# Patient Record
Sex: Male | Born: 1945 | Race: White | Hispanic: No | Marital: Married | Smoking: Never smoker
Health system: Southern US, Community
[De-identification: ages and names within clinical notes are randomized; demographics above are authoritative.]

## PROBLEM LIST (undated history)

## (undated) DIAGNOSIS — I10 Essential (primary) hypertension: Secondary | ICD-10-CM

## (undated) DIAGNOSIS — Z860100 Personal history of colon polyps, unspecified: Secondary | ICD-10-CM

## (undated) DIAGNOSIS — E119 Type 2 diabetes mellitus without complications: Secondary | ICD-10-CM

## (undated) DIAGNOSIS — G4730 Sleep apnea, unspecified: Secondary | ICD-10-CM

## (undated) DIAGNOSIS — K219 Gastro-esophageal reflux disease without esophagitis: Secondary | ICD-10-CM

## (undated) DIAGNOSIS — E785 Hyperlipidemia, unspecified: Secondary | ICD-10-CM

## (undated) HISTORY — DX: Gastro-esophageal reflux disease without esophagitis: K21.9

## (undated) HISTORY — DX: Hyperlipidemia, unspecified: E78.5

## (undated) HISTORY — DX: Personal history of colonic polyps: Z86.010

## (undated) HISTORY — PX: CATARACT EXTRACTION, BILATERAL: SHX1313

## (undated) HISTORY — DX: Type 2 diabetes mellitus without complications: E11.9

## (undated) HISTORY — DX: Personal history of colon polyps, unspecified: Z86.0100

---

## 2006-04-30 HISTORY — PX: COLONOSCOPY: SHX174

## 2008-06-15 ENCOUNTER — Ambulatory Visit: Payer: Self-pay | Admitting: Cardiology

## 2010-06-18 ENCOUNTER — Ambulatory Visit: Payer: Self-pay | Admitting: Internal Medicine

## 2010-06-18 DIAGNOSIS — K219 Gastro-esophageal reflux disease without esophagitis: Secondary | ICD-10-CM | POA: Insufficient documentation

## 2010-06-18 DIAGNOSIS — R1310 Dysphagia, unspecified: Secondary | ICD-10-CM | POA: Insufficient documentation

## 2010-06-19 ENCOUNTER — Encounter: Payer: Self-pay | Admitting: Internal Medicine

## 2010-07-06 ENCOUNTER — Ambulatory Visit (HOSPITAL_COMMUNITY): Admission: RE | Admit: 2010-07-06 | Discharge: 2010-07-06 | Payer: Self-pay | Admitting: Internal Medicine

## 2010-07-06 ENCOUNTER — Ambulatory Visit: Payer: Self-pay | Admitting: Internal Medicine

## 2010-07-06 HISTORY — PX: ESOPHAGOGASTRODUODENOSCOPY: SHX1529

## 2010-10-27 NOTE — Assessment & Plan Note (Signed)
Summary: DYSPHAGIA/SS   Visit Type:  Initial Consult Referring Edwin Burgess:  Edwin Burgess Primary Care Edwin Burgess:  Edwin Burgess  Chief Complaint:  dysphagia.  History of Present Illness: Edwin Burgess is a pleasant 65 y/o obese WM, who presents at request of Dr. Sherryll Burgess for further evaluation of dysphagia.  Chronic dysphagia since 1990s. EGD by Dr. Cleotis Nipper in 90s reported showed hiatal hernia. Has trouble swallowing pills, stick in throat/upper esophagus. Cuts his pills up or takes liquid form. Cuts food up finely, chews thoroughly. Sometimes food gets lodged in esophagus and it comes back up. No odynophagia. No heartburn since on ranitidine. Started taking ranitidine in the 1990s.  Before had nocturnal symptoms. No BM issues. Denies constipation, diarrhea, melena, brbpr. Last TCS 2007 by Dr. Cleotis Nipper, ?polyps.    Current Medications (verified): 1)  Glucophage 500 Mg Tabs (Metformin Hcl) .... One Tablet Twice Daily 2)  Fish Oil Liquid .... Take 1000 Mg Daily 3)  Singulair 10 Mg Tabs (Montelukast Sodium) .... Take 1 Tablet By Mouth Once A Day 4)  Advair Diskus 250-50 Mcg/dose Aepb (Fluticasone-Salmeterol) .... Take 1 Tablet By Mouth Two Times A Day 5)  Ranitidine Hcl 150 Mg Tabs (Ranitidine Hcl) .... Take 1 Tablet By Mouth Once A Day At Bedtime 6)  Claritin 10 Mg Tabs (Loratadine) .... Take 1 Tablet By Mouth Once A Day 7)  Centrum Multi-Vitamin Chewables .... Once Daily  Allergies (verified): 1)  ! Tetracycline  Past History:  Past Medical History: Diabetes EGD, Dr. Cleotis Nipper, 1999, hiatal hernia per patient Asthma Hyperlipidemia GERD TCS, 2007, Dr. Cleotis Nipper, ?polyps  Past Surgical History: right eye cataract left eye cataract  Family History: Maternal GF, colon cancer, advanced age, deceased age 2.  Social History: Married. Two children. Retired Runner, broadcasting/film/video. Still substitutes.  [employer]. Never smoked. No alcohol. No drugs.   Review of Systems General:  Denies fever, chills,  sweats, anorexia, fatigue, weakness, and weight loss. Eyes:  Denies vision loss. ENT:  Complains of difficulty swallowing; denies sore throat and hoarseness. CV:  Denies chest pains, angina, palpitations, dyspnea on exertion, and peripheral edema. Resp:  Denies dyspnea at rest, dyspnea with exercise, cough, sputum, and wheezing. GI:  See HPI. GU:  Denies urinary burning and blood in urine. MS:  Denies joint pain / LOM. Derm:  Denies rash and itching. Neuro:  Denies weakness, frequent headaches, memory loss, and confusion. Psych:  Denies depression and anxiety. Endo:  Denies unusual weight change. Heme:  Denies bruising and bleeding. Allergy:  Denies hives and rash.  Vital Signs:  Patient profile:   65 year old male Height:      66 inches Weight:      208 pounds BMI:     33.69 Temp:     98.0 degrees F oral Pulse rate:   88 / minute BP sitting:   130 / 74  (left arm) Cuff size:   regular  Vitals Entered By: Cloria Spring LPN (June 18, 2010 2:39 PM)  Physical Exam  General:  Well developed, well nourished, no acute distress.obese.   Head:  Normocephalic and atraumatic. Eyes:  sclera nonicteric Mouth:  Oropharyngeal mucosa moist, pink.  No lesions, erythema or exudate.    Neck:  Supple; no masses or thyromegaly. Lungs:  Clear throughout to auscultation. Heart:  Regular rate and rhythm; no murmurs, rubs,  or bruits. Abdomen:  obese. Bowel sounds normal.  Abdomen is soft, nontender, nondistended.  No rebound or guarding.  No hepatosplenomegaly, masses or hernias.  No abdominal bruits.  Extremities:  No clubbing, cyanosis, edema or deformities noted. Neurologic:  Alert and  oriented x4;  grossly normal neurologically. Skin:  Intact without significant lesions or rashes. Cervical Nodes:  No significant cervical adenopathy. Psych:  Alert and cooperative. Normal mood and affect.  Impression & Recommendations:  Problem # 1:  DYSPHAGIA UNSPECIFIED (ICD-787.20)  Dysphagia to  solids and pills, chronic. Chronic GERD, symptoms well-controlled on H2 blocker. DDx includes esophageal web, ring, stricture. Less likely malignancy. EGD/ED to be performed in near future.  Risks, alternatives, benefits including but not limited to risk of reaction to medications, bleeding, infection, and perforation addressed.  Patient voiced understanding and verbal consent obtained.   Orders: Consultation Level III (16109)  Problem # 2:  SCREENING COLORECTAL-CANCER (ICD-V76.51) Patient had last TCS 2007. ?h/o colonic polyps. Advised he follow-up with Dr. Sherryll Burgess to determine when next TCS is due based on previous findings. May be due next year if h/o adenomatous polyps. I would like to thank Dr. Sherryll Burgess for allowing [place] to take part in the care of this nice patient.

## 2010-10-27 NOTE — Letter (Signed)
Summary: REFERRAL FROM DR [facility]  REFERRAL FROM DR [facility]   Imported By: Rexene Alberts 06/19/2010 12:20:50  _____________________________________________________________________  External Attachment:    Type:   Image     Comment:   External Document

## 2010-10-27 NOTE — Letter (Signed)
Summary: EGD/ED ORDER  EGD/ED ORDER   Imported By: Ave Filter 06/18/2010 15:22:11  _____________________________________________________________________  External Attachment:    Type:   Image     Comment:   External Document

## 2010-12-10 LAB — GLUCOSE, CAPILLARY

## 2010-12-14 ENCOUNTER — Encounter: Payer: Self-pay | Admitting: Gastroenterology

## 2010-12-24 NOTE — Medication Information (Signed)
Summary: PA for pantoprazole  PA for pantoprazole   Imported By: Hendricks Limes LPN 03/47/4259 56:38:75  _____________________________________________________________________  External Attachment:    Type:   Image     Comment:   External Document

## 2011-11-03 ENCOUNTER — Encounter: Payer: Self-pay | Admitting: Internal Medicine

## 2011-11-11 ENCOUNTER — Encounter: Payer: Self-pay | Admitting: Internal Medicine

## 2011-11-12 ENCOUNTER — Encounter: Payer: Self-pay | Admitting: Gastroenterology

## 2011-11-12 ENCOUNTER — Ambulatory Visit (INDEPENDENT_AMBULATORY_CARE_PROVIDER_SITE_OTHER): Payer: Medicare Other | Admitting: Gastroenterology

## 2011-11-12 VITALS — BP 140/76 | HR 93 | Temp 97.6°F | Ht 66.0 in | Wt 194.4 lb

## 2011-11-12 DIAGNOSIS — K219 Gastro-esophageal reflux disease without esophagitis: Secondary | ICD-10-CM

## 2011-11-12 MED ORDER — PANTOPRAZOLE SODIUM 40 MG PO TBEC: 40.0000 mg | DELAYED_RELEASE_TABLET | Freq: Every day | ORAL | Status: DC

## 2011-11-12 NOTE — Patient Instructions (Signed)
Please discuss with Dr. Sherryll Burger regarding when your next colonoscopy is due.  Office visit here in one year.

## 2011-11-12 NOTE — Progress Notes (Signed)
Primary Care Physician: Kirstie Peri, MD, MD  Primary Gastroenterologist:  Roetta Sessions, MD   Chief Complaint  Patient presents with  . Medication Refill    HPI: Edwin Burgess is a 66 y.o. male here for follow-up GERD/dysphagia. Doing well since EGD 2011. Dysphagia resolved. No heartburn. Takes protonix daily. No abdominal pain, constipation, diarrhea, melena, brbpr. ?h/o colon polyps in 2007 on TCS by Dr. Marcell Anger.   Current Outpatient Prescriptions  Medication Sig Dispense Refill  . albuterol (PROVENTIL HFA;VENTOLIN HFA) 108 (90 BASE) MCG/ACT inhaler Inhale 2 puffs into the lungs every 6 (six) hours as needed.      Marland Kitchen aspirin 81 MG tablet Take 81 mg by mouth daily.      . Coenzyme Q10 (CO Q 10 PO) Take 200 mg by mouth daily.      . fish oil-omega-3 fatty acids 1000 MG capsule Take 3 g by mouth daily.      . Fluticasone-Salmeterol (ADVAIR) 250-50 MCG/DOSE AEPB Inhale 1 puff into the lungs every 12 (twelve) hours.      Marland Kitchen loratadine (CLARITIN) 10 MG tablet Take 10 mg by mouth daily.      . metFORMIN (GLUCOPHAGE) 500 MG tablet Take 500 mg by mouth 2 (two) times daily with a meal.       . montelukast (SINGULAIR) 10 MG tablet Take 10 mg by mouth at bedtime.      . Multiple Vitamin (MULTIVITAMIN) capsule Take 1 capsule by mouth daily.      . pantoprazole (PROTONIX) 40 MG tablet Take 40 mg by mouth daily.       . theophylline (THEODUR) 300 MG 12 hr tablet Take 300 mg by mouth 2 (two) times daily.         Allergies as of 11/12/2011 - Review Complete 11/12/2011  Allergen Reaction Noted  . Tetracycline      ROS:  General: Negative for anorexia, weight loss, fever, chills, fatigue, weakness. ENT: Negative for hoarseness, difficulty swallowing , nasal congestion. CV: Negative for chest pain, angina, palpitations, dyspnea on exertion, peripheral edema.  Respiratory: Negative for dyspnea at rest, dyspnea on exertion, cough, sputum, wheezing.  GI: See history of present illness. GU:   Negative for dysuria, hematuria, urinary incontinence, urinary frequency, nocturnal urination.  Endo: Negative for unusual weight change.    Physical Examination:   BP 140/76  Pulse 93  Temp(Src) 97.6 F (36.4 C) (Temporal)  Ht 5\' 6"  (1.676 m)  Wt 194 lb 6.4 oz (88.179 kg)  BMI 31.38 kg/m2  General: Well-nourished, well-developed in no acute distress.  Eyes: No icterus. Mouth: Oropharyngeal mucosa moist and pink , no lesions erythema or exudate. Lungs: Clear to auscultation bilaterally.  Heart: Regular rate and rhythm, no murmurs rubs or gallops.  Abdomen: Bowel sounds are normal, nontender, nondistended, no hepatosplenomegaly or masses, no abdominal bruits or hernia , no rebound or guarding.   Extremities: No lower extremity edema. No clubbing or deformities. Neuro: Alert and oriented x 4   Skin: Warm and dry, no jaundice.   Psych: Alert and cooperative, normal mood and affect.

## 2011-11-12 NOTE — Assessment & Plan Note (Signed)
Doing well. Continue anti-reflux measures. Continue pantoprazole 40mg  daily. New RX for one year provided. OV one year.  Advised to discuss with Dr. Sherryll Burger next month at his PE regarding his next colonoscopy. We do not have his previous reports but if he had adenomatous polyps on prior colonoscopy, would advise for f/u TCS 5 years after last one. He agrees to discuss with Dr. Sherryll Burger and call if has any questions.

## 2011-11-15 NOTE — Progress Notes (Signed)
Faxed to PCP

## 2011-11-23 ENCOUNTER — Ambulatory Visit: Payer: Self-pay | Admitting: Internal Medicine

## 2011-12-01 NOTE — Progress Notes (Signed)
REVIEWED.  

## 2012-05-15 ENCOUNTER — Ambulatory Visit (INDEPENDENT_AMBULATORY_CARE_PROVIDER_SITE_OTHER): Payer: Medicare Other | Admitting: Gastroenterology

## 2012-05-15 ENCOUNTER — Other Ambulatory Visit: Payer: Self-pay | Admitting: Gastroenterology

## 2012-05-15 ENCOUNTER — Encounter: Payer: Self-pay | Admitting: Gastroenterology

## 2012-05-15 VITALS — BP 142/81 | HR 88 | Temp 98.2°F | Ht 66.0 in | Wt 193.4 lb

## 2012-05-15 DIAGNOSIS — K219 Gastro-esophageal reflux disease without esophagitis: Secondary | ICD-10-CM

## 2012-05-15 DIAGNOSIS — Z860100 Personal history of colon polyps, unspecified: Secondary | ICD-10-CM

## 2012-05-15 DIAGNOSIS — Z860101 Personal history of adenomatous and serrated colon polyps: Secondary | ICD-10-CM | POA: Insufficient documentation

## 2012-05-15 DIAGNOSIS — Z800 Family history of malignant neoplasm of digestive organs: Secondary | ICD-10-CM

## 2012-05-15 MED ORDER — PEG 3350-KCL-NA BICARB-NACL 420 G PO SOLR: 4000.0000 L | ORAL | Status: AC

## 2012-05-15 NOTE — Assessment & Plan Note (Signed)
Doing well on pantoprazole.  

## 2012-05-15 NOTE — Patient Instructions (Addendum)
We have scheduled you for a colonoscopy with Dr. Darrick Penna. Please see separate instructions.  Day of bowel prep you will take 1/2 dose of metformin (250mg  twice with meal).

## 2012-05-15 NOTE — Progress Notes (Signed)
Primary Care Physician:  Kirstie Peri, MD  Primary Gastroenterologist:  Jonette Eva, MD   Chief Complaint  Patient presents with  . Colonoscopy    HPI:  Edwin Burgess is a 66 y.o. male here for consideration of colonoscopy. He has h/o colon polyps in the past. At time of colonoscopy with Dr. Cleotis Nipper in 2007, mild diverticulosis. He was last seen here in 10/2011 for f/u GERD/dysphaiga.  He is doing well. Protonix controls his heartburn. No dysphagia to solid foods or liquids. Has little trouble with pills but thinks it is psychological, has chronically had issues. No n/v, abd pain, constipation, melena, brbpr, unintentional weight loss. He has intermittent diarrhea but not bad. States Dr. Sherryll Burger says it is time for colonoscopy based on previous records.    Current Outpatient Prescriptions  Medication Sig Dispense Refill  . albuterol (PROVENTIL HFA;VENTOLIN HFA) 108 (90 BASE) MCG/ACT inhaler Inhale 2 puffs into the lungs every 6 (six) hours as needed.      Marland Kitchen aspirin 81 MG tablet Take 81 mg by mouth daily.      . Fluticasone-Salmeterol (ADVAIR) 250-50 MCG/DOSE AEPB Inhale 1 puff into the lungs every 12 (twelve) hours.      Marland Kitchen loratadine (CLARITIN) 10 MG tablet Take 10 mg by mouth daily.      . metFORMIN (GLUCOPHAGE) 500 MG tablet Take 500 mg by mouth 2 (two) times daily with a meal.       . montelukast (SINGULAIR) 10 MG tablet Take 10 mg by mouth at bedtime.      . Multiple Vitamin (MULTIVITAMIN) capsule Take 1 capsule by mouth daily.      . pantoprazole (PROTONIX) 40 MG tablet Take 1 tablet (40 mg total) by mouth daily.  30 tablet  11  . theophylline (THEODUR) 300 MG 12 hr tablet Take 300 mg by mouth 2 (two) times daily.         Allergies as of 05/15/2012 - Review Complete 05/15/2012  Allergen Reaction Noted  . Tetracycline      Past Medical History  Diagnosis Date  . GERD (gastroesophageal reflux disease)   . Asthma   . DM (diabetes mellitus)   . Hyperlipidemia   . Personal  history of colonic polyps     Past Surgical History  Procedure Date  . Esophagogastroduodenoscopy 07/06/10    tight schatzki ring s/p dilation/small hiatal hernia  . Cataract extraction, bilateral   . Colonoscopy Aug 12/2005    dr.fleishman/mild diverticulosis. recommended to have next TCS 2012.    Family History  Problem Relation Age of Onset  . Colon cancer Maternal Grandfather 91  . Breast cancer Maternal Aunt     History   Social History  . Marital Status: Married    Spouse Name: N/A    Number of Children: 2  . Years of Education: N/A   Occupational History  . [employer]   . retired Engineer, site    Social History Main Topics  . Smoking status: Never Smoker   . Smokeless tobacco: Not on file  . Alcohol Use: No  . Drug Use: No  . Sexually Active: Not on file   Other Topics Concern  . Not on file   Social History Narrative  . No narrative on file      ROS:  General: Negative for anorexia, weight loss, fever, chills, fatigue, weakness. Eyes: Negative for vision changes.  ENT: Negative for hoarseness, difficulty swallowing , nasal congestion. CV: Negative for chest pain, angina, palpitations, dyspnea on  exertion, peripheral edema.  Respiratory: Negative for dyspnea at rest, dyspnea on exertion, cough, sputum, wheezing.  GI: See history of present illness. GU:  Negative for dysuria, hematuria, urinary incontinence, urinary frequency, nocturnal urination.  MS: Negative for joint pain, low back pain.  Derm: Negative for rash or itching.  Neuro: Negative for weakness, abnormal sensation, seizure, frequent headaches, memory loss, confusion.  Psych: Negative for anxiety, depression, suicidal ideation, hallucinations.  Endo: Negative for unusual weight change.  Heme: Negative for bruising or bleeding. Allergy: Negative for rash or hives.    Physical Examination:  BP 142/81  Pulse 88  Temp 98.2 F (36.8 C) (Temporal)  Ht 5\' 6"  (1.676 m)  Wt 193  lb 6.4 oz (87.726 kg)  BMI 31.22 kg/m2   General: Well-nourished, well-developed in no acute distress.  Head: Normocephalic, atraumatic.   Eyes: Conjunctiva pink, no icterus. Mouth: Oropharyngeal mucosa moist and pink , no lesions erythema or exudate. Neck: Supple without thyromegaly, masses, or lymphadenopathy.  Lungs: Clear to auscultation bilaterally.  Heart: Regular rate and rhythm, no murmurs rubs or gallops.  Abdomen: Bowel sounds are normal, nontender, nondistended, no hepatosplenomegaly or masses, no abdominal bruits or    hernia , no rebound or guarding.   Rectal: defer Extremities: No lower extremity edema. No clubbing or deformities.  Neuro: Alert and oriented x 4 , grossly normal neurologically.  Skin: Warm and dry, no rash or jaundice.   Psych: Alert and cooperative, normal mood and affect.

## 2012-05-15 NOTE — Progress Notes (Signed)
Faxed to PCP

## 2012-05-15 NOTE — Assessment & Plan Note (Signed)
Patient reports h/o colon polyps on prior colonoscopy. His last TCS in 2007. He has family hx of breast and colon cancer in second degree relatives. Offered colonoscopy at this time.  I have discussed the risks, alternatives, benefits with regards to but not limited to the risk of reaction to medication, bleeding, infection, perforation and the patient is agreeable to proceed. Written consent to be obtained.  Day of prep: 1/2 dose metformin.

## 2012-05-31 ENCOUNTER — Encounter (HOSPITAL_COMMUNITY): Payer: Self-pay | Admitting: Pharmacy Technician

## 2012-06-15 ENCOUNTER — Encounter (HOSPITAL_COMMUNITY): Payer: Self-pay | Admitting: *Deleted

## 2012-06-15 ENCOUNTER — Ambulatory Visit (HOSPITAL_COMMUNITY)
Admission: RE | Admit: 2012-06-15 | Discharge: 2012-06-15 | Disposition: A | Discharge status: Home / Self Care | Payer: Medicare Other | Source: Ambulatory Visit | Attending: Gastroenterology | Admitting: Gastroenterology

## 2012-06-15 ENCOUNTER — Encounter (HOSPITAL_COMMUNITY)
Admission: RE | Disposition: A | Discharge status: Home / Self Care | Payer: Self-pay | Source: Ambulatory Visit | Attending: Gastroenterology

## 2012-06-15 DIAGNOSIS — K648 Other hemorrhoids: Secondary | ICD-10-CM

## 2012-06-15 DIAGNOSIS — K5730 Diverticulosis of large intestine without perforation or abscess without bleeding: Secondary | ICD-10-CM | POA: Insufficient documentation

## 2012-06-15 DIAGNOSIS — Z1211 Encounter for screening for malignant neoplasm of colon: Secondary | ICD-10-CM

## 2012-06-15 DIAGNOSIS — E119 Type 2 diabetes mellitus without complications: Secondary | ICD-10-CM | POA: Insufficient documentation

## 2012-06-15 DIAGNOSIS — Z860100 Personal history of colon polyps, unspecified: Secondary | ICD-10-CM | POA: Insufficient documentation

## 2012-06-15 DIAGNOSIS — Z800 Family history of malignant neoplasm of digestive organs: Secondary | ICD-10-CM

## 2012-06-15 DIAGNOSIS — D126 Benign neoplasm of colon, unspecified: Secondary | ICD-10-CM | POA: Insufficient documentation

## 2012-06-15 DIAGNOSIS — E785 Hyperlipidemia, unspecified: Secondary | ICD-10-CM | POA: Insufficient documentation

## 2012-06-15 HISTORY — PX: COLONOSCOPY: SHX5424

## 2012-06-15 LAB — GLUCOSE, CAPILLARY: Glucose-Capillary: 142 mg/dL — ABNORMAL HIGH (ref 70–99)

## 2012-06-15 SURGERY — COLONOSCOPY
Anesthesia: Moderate Sedation

## 2012-06-15 MED ORDER — MEPERIDINE HCL 100 MG/ML IJ SOLN
INTRAMUSCULAR | Status: AC
  Filled 2012-06-15: qty 2

## 2012-06-15 MED ORDER — MIDAZOLAM HCL 5 MG/5ML IJ SOLN
INTRAMUSCULAR | Status: AC
  Filled 2012-06-15: qty 10

## 2012-06-15 MED ORDER — SODIUM CHLORIDE 0.45 % IV SOLN
INTRAVENOUS | Status: DC
  Administered 2012-06-15: 12:00:00 via INTRAVENOUS

## 2012-06-15 MED ORDER — STERILE WATER FOR IRRIGATION IR SOLN
Status: DC | PRN
  Administered 2012-06-15: 12:00:00

## 2012-06-15 MED ORDER — MEPERIDINE HCL 100 MG/ML IJ SOLN
INTRAMUSCULAR | Status: DC | PRN
  Administered 2012-06-15 (×2): 25 mg via INTRAVENOUS

## 2012-06-15 MED ORDER — MIDAZOLAM HCL 5 MG/5ML IJ SOLN
INTRAMUSCULAR | Status: DC | PRN
  Administered 2012-06-15 (×2): 2 mg via INTRAVENOUS
  Administered 2012-06-15: 1 mg via INTRAVENOUS

## 2012-06-15 NOTE — H&P (Signed)
  Primary Care Physician:  Kirstie Peri, MD Primary Gastroenterologist:  Dr. Darrick Penna  Pre-Procedure History & Physical: HPI:  Edwin Burgess is a 66 y.o. male here for  PERSONAL HISTORY OF POLYPS.   Past Medical History  Diagnosis Date  . GERD (gastroesophageal reflux disease)   . Asthma   . DM (diabetes mellitus)   . Hyperlipidemia   . Personal history of colonic polyps     Past Surgical History  Procedure Date  . Esophagogastroduodenoscopy 07/06/10    tight schatzki ring s/p dilation/small hiatal hernia  . Cataract extraction, bilateral   . Colonoscopy Aug 12/2005    dr.fleishman/mild diverticulosis. recommended to have next TCS 2012.    Prior to Admission medications   Medication Sig Start Date End Date Taking? Authorizing Provider  albuterol (PROVENTIL HFA;VENTOLIN HFA) 108 (90 BASE) MCG/ACT inhaler Inhale 2 puffs into the lungs every 6 (six) hours as needed. Asthma   Yes Historical Provider, MD  aspirin 81 MG tablet Take 81 mg by mouth daily.   Yes Historical Provider, MD  Fluticasone-Salmeterol (ADVAIR) 250-50 MCG/DOSE AEPB Inhale 1 puff into the lungs every 12 (twelve) hours.   Yes Historical Provider, MD  loratadine (CLARITIN) 10 MG tablet Take 10 mg by mouth daily.   Yes Historical Provider, MD  metFORMIN (GLUCOPHAGE) 500 MG tablet Take 1,000 mg by mouth at bedtime.  10/13/11  Yes Historical Provider, MD  Multiple Vitamin (MULTIVITAMIN) capsule Take 1 capsule by mouth daily.   Yes Historical Provider, MD  theophylline (THEODUR) 300 MG 12 hr tablet Take 300 mg by mouth 2 (two) times daily.  10/19/11  Yes Historical Provider, MD  montelukast (SINGULAIR) 10 MG tablet Take 10 mg by mouth at bedtime.    Historical Provider, MD  pantoprazole (PROTONIX) 40 MG tablet Take 40 mg by mouth at bedtime. 11/12/11   Tiffany Kocher, PA    Allergies as of 05/15/2012 - Review Complete 05/15/2012  Allergen Reaction Noted  . Tetracycline      Family History  Problem Relation Age of Onset   . Colon cancer Maternal Grandfather 91  . Breast cancer Maternal Aunt     History   Social History  . Marital Status: Married    Spouse Name: N/A    Number of Children: 2  . Years of Education: N/A   Occupational History  . [employer]   . retired Engineer, site    Social History Main Topics  . Smoking status: Never Smoker   . Smokeless tobacco: Not on file  . Alcohol Use: No  . Drug Use: No  . Sexually Active: Not on file   Other Topics Concern  . Not on file   Social History Narrative  . No narrative on file    Review of Systems: See HPI, otherwise negative ROS   Physical Exam: BP 162/86  Pulse 80  Temp 97.5 F (36.4 C) (Oral)  Resp 20  Ht 5\' 6"  (1.676 m)  Wt 193 lb (87.544 kg)  BMI 31.15 kg/m2  SpO2 97% General:   Alert,  pleasant and cooperative in NAD Head:  Normocephalic and atraumatic. Neck:  Supple; Lungs:  Clear throughout to auscultation.    Heart:  Regular rate and rhythm. Abdomen:  Soft, nontender and nondistended. Normal bowel sounds, without guarding, and without rebound.   Neurologic:  Alert and  oriented x4;  grossly normal neurologically.  Impression/Plan:     PERSONAL HISTORY OF POLYPS.  PLAN: 1. TCS TODAY

## 2012-06-15 NOTE — Op Note (Signed)
[facility] [address] [place], [address]   COLONOSCOPY PROCEDURE REPORT  PATIENT: Edwin, Burgess  MR#: [id number] BIRTHDATE: 05-Mar-1946 , 66  yrs. old GENDER: Male ENDOSCOPIST: Jonette Eva, MD REFERRED GN:FAOZHY Sherryll Burger, M.D. PROCEDURE DATE:  06/15/2012 PROCEDURE:   Colonoscopy with biopsy and Colonoscopy with snare polypectomy INDICATIONS: personal history of colonic polyps. MEDICATIONS: Demerol 50 mg IV and Versed 5 mg IV  DESCRIPTION OF PROCEDURE:    Physical exam was performed.  Informed consent was obtained from the patient after explaining the benefits, risks, and alternatives to procedure.  The patient was connected to monitor and placed in left lateral position. Continuous oxygen was provided by nasal cannula and IV medicine administered through an indwelling cannula.  After administration of sedation and rectal exam, the patients rectum was intubated and the Pentax Colonoscope [phone]  colonoscope was advanced under direct visualization to the cecum.  The scope was removed slowly by carefully examining the color, texture, anatomy, and integrity mucosa on the way out.  The patient was recovered in endoscopy and discharged home in satisfactory condition.       COLON FINDINGS: Eight sessile polyps measuring 2-6 mm in size were found at the cecum, in the ascending colon, at the splenic flexure, in the descending colon, and sigmoid colon.  A polypectomy was performed with cold forceps and using snare cautery.  The resection was complete and the polyp tissue was completely retrieved, Mild diverticulosis was noted in the sigmoid colon.  , and Small internal hemorrhoids were found.  PREP QUALITY: good. CECAL W/D TIME: 27 minutes  COMPLICATIONS: None  ENDOSCOPIC IMPRESSION: 1.   Eight sessile polyps measuring 2-6 mm in size were found at the cecum, in the ascending colon, at the splenic flexure, in the descending colon, and sigmoid colon;  polypectomy was performed with cold forceps and using snare cautery 2.   Mild diverticulosis was noted in the sigmoid colon 3.   Small internal hemorrhoids   RECOMMENDATIONS: 1.  Await biopsy results HIGH FIBER DIET TCS IN 5 YEARS DUE TO > 5 POLYPS REMOVED 2.  Await biopsy results HIGH FIBER DIET TCS IN 5 YEARS DUE TO > 5 POLYPS REMOVED       _______________________________ Rosalie DoctorJonette Eva, MD 06/15/2012 12:46 PM     PATIENT NAME:  Edwin Burgess MR#: [id number]

## 2012-06-20 ENCOUNTER — Encounter (HOSPITAL_COMMUNITY): Payer: Self-pay | Admitting: Gastroenterology

## 2012-06-20 ENCOUNTER — Telehealth: Payer: Self-pay | Admitting: Gastroenterology

## 2012-06-20 NOTE — Telephone Encounter (Signed)
Called and informed pt.  

## 2012-06-20 NOTE — Telephone Encounter (Signed)
Please call pt. HE had simple adenomas removed from HIS colon. FOLLOW A HIGH FIBER DIET. TCS IN 5 YEARS. 

## 2012-06-20 NOTE — Telephone Encounter (Signed)
Path faxed to PCP, recall made 

## 2012-07-09 NOTE — Progress Notes (Signed)
TCS SEP 2013 SIMPLE ADENOMA  REVIEWED.

## 2012-11-11 ENCOUNTER — Other Ambulatory Visit: Payer: Self-pay

## 2012-11-23 ENCOUNTER — Encounter: Payer: Self-pay | Admitting: Gastroenterology

## 2012-11-27 ENCOUNTER — Ambulatory Visit: Payer: Medicare Other | Admitting: Gastroenterology

## 2013-05-02 ENCOUNTER — Other Ambulatory Visit: Payer: Self-pay

## 2013-08-02 ENCOUNTER — Other Ambulatory Visit: Payer: Self-pay

## 2015-10-14 DIAGNOSIS — E7800 Pure hypercholesterolemia, unspecified: Secondary | ICD-10-CM | POA: Diagnosis not present

## 2015-10-14 DIAGNOSIS — Z789 Other specified health status: Secondary | ICD-10-CM | POA: Diagnosis not present

## 2015-10-14 DIAGNOSIS — E1122 Type 2 diabetes mellitus with diabetic chronic kidney disease: Secondary | ICD-10-CM | POA: Diagnosis not present

## 2015-10-14 DIAGNOSIS — N182 Chronic kidney disease, stage 2 (mild): Secondary | ICD-10-CM | POA: Diagnosis not present

## 2015-10-14 DIAGNOSIS — Z6834 Body mass index (BMI) 34.0-34.9, adult: Secondary | ICD-10-CM | POA: Diagnosis not present

## 2016-01-16 DIAGNOSIS — E1122 Type 2 diabetes mellitus with diabetic chronic kidney disease: Secondary | ICD-10-CM | POA: Diagnosis not present

## 2016-01-16 DIAGNOSIS — Z6834 Body mass index (BMI) 34.0-34.9, adult: Secondary | ICD-10-CM | POA: Diagnosis not present

## 2016-01-16 DIAGNOSIS — Z789 Other specified health status: Secondary | ICD-10-CM | POA: Diagnosis not present

## 2016-01-16 DIAGNOSIS — E7800 Pure hypercholesterolemia, unspecified: Secondary | ICD-10-CM | POA: Diagnosis not present

## 2016-01-16 DIAGNOSIS — N182 Chronic kidney disease, stage 2 (mild): Secondary | ICD-10-CM | POA: Diagnosis not present

## 2016-01-26 DIAGNOSIS — S80861A Insect bite (nonvenomous), right lower leg, initial encounter: Secondary | ICD-10-CM | POA: Diagnosis not present

## 2016-01-26 DIAGNOSIS — Z299 Encounter for prophylactic measures, unspecified: Secondary | ICD-10-CM | POA: Diagnosis not present

## 2016-01-26 DIAGNOSIS — W57XXXA Bitten or stung by nonvenomous insect and other nonvenomous arthropods, initial encounter: Secondary | ICD-10-CM | POA: Diagnosis not present

## 2016-02-04 DIAGNOSIS — Z961 Presence of intraocular lens: Secondary | ICD-10-CM | POA: Diagnosis not present

## 2016-02-04 DIAGNOSIS — E119 Type 2 diabetes mellitus without complications: Secondary | ICD-10-CM | POA: Diagnosis not present

## 2016-04-20 DIAGNOSIS — E7800 Pure hypercholesterolemia, unspecified: Secondary | ICD-10-CM | POA: Diagnosis not present

## 2016-04-20 DIAGNOSIS — N182 Chronic kidney disease, stage 2 (mild): Secondary | ICD-10-CM | POA: Diagnosis not present

## 2016-04-20 DIAGNOSIS — E1122 Type 2 diabetes mellitus with diabetic chronic kidney disease: Secondary | ICD-10-CM | POA: Diagnosis not present

## 2016-04-20 DIAGNOSIS — I10 Essential (primary) hypertension: Secondary | ICD-10-CM | POA: Diagnosis not present

## 2016-05-13 DIAGNOSIS — K2270 Barrett's esophagus without dysplasia: Secondary | ICD-10-CM | POA: Diagnosis not present

## 2016-05-19 DIAGNOSIS — M25571 Pain in right ankle and joints of right foot: Secondary | ICD-10-CM | POA: Diagnosis not present

## 2016-05-19 DIAGNOSIS — R937 Abnormal findings on diagnostic imaging of other parts of musculoskeletal system: Secondary | ICD-10-CM | POA: Diagnosis not present

## 2016-05-19 DIAGNOSIS — E1122 Type 2 diabetes mellitus with diabetic chronic kidney disease: Secondary | ICD-10-CM | POA: Diagnosis not present

## 2016-05-19 DIAGNOSIS — N182 Chronic kidney disease, stage 2 (mild): Secondary | ICD-10-CM | POA: Diagnosis not present

## 2016-05-19 DIAGNOSIS — S9031XA Contusion of right foot, initial encounter: Secondary | ICD-10-CM | POA: Diagnosis not present

## 2016-05-19 DIAGNOSIS — S9001XA Contusion of right ankle, initial encounter: Secondary | ICD-10-CM | POA: Diagnosis not present

## 2016-05-19 DIAGNOSIS — M79671 Pain in right foot: Secondary | ICD-10-CM | POA: Diagnosis not present

## 2016-05-19 DIAGNOSIS — M25471 Effusion, right ankle: Secondary | ICD-10-CM | POA: Diagnosis not present

## 2016-07-19 DIAGNOSIS — Z23 Encounter for immunization: Secondary | ICD-10-CM | POA: Diagnosis not present

## 2016-08-26 DIAGNOSIS — N182 Chronic kidney disease, stage 2 (mild): Secondary | ICD-10-CM | POA: Diagnosis not present

## 2016-08-26 DIAGNOSIS — Z125 Encounter for screening for malignant neoplasm of prostate: Secondary | ICD-10-CM | POA: Diagnosis not present

## 2016-08-26 DIAGNOSIS — E7800 Pure hypercholesterolemia, unspecified: Secondary | ICD-10-CM | POA: Diagnosis not present

## 2016-08-26 DIAGNOSIS — Z1389 Encounter for screening for other disorder: Secondary | ICD-10-CM | POA: Diagnosis not present

## 2016-08-26 DIAGNOSIS — Z0000 Encounter for general adult medical examination without abnormal findings: Secondary | ICD-10-CM | POA: Diagnosis not present

## 2016-08-26 DIAGNOSIS — R5383 Other fatigue: Secondary | ICD-10-CM | POA: Diagnosis not present

## 2016-08-26 DIAGNOSIS — Z79899 Other long term (current) drug therapy: Secondary | ICD-10-CM | POA: Diagnosis not present

## 2016-08-26 DIAGNOSIS — R05 Cough: Secondary | ICD-10-CM | POA: Diagnosis not present

## 2016-08-26 DIAGNOSIS — Z6834 Body mass index (BMI) 34.0-34.9, adult: Secondary | ICD-10-CM | POA: Diagnosis not present

## 2016-08-26 DIAGNOSIS — E1122 Type 2 diabetes mellitus with diabetic chronic kidney disease: Secondary | ICD-10-CM | POA: Diagnosis not present

## 2016-08-26 DIAGNOSIS — Z299 Encounter for prophylactic measures, unspecified: Secondary | ICD-10-CM | POA: Diagnosis not present

## 2016-08-26 DIAGNOSIS — Z7189 Other specified counseling: Secondary | ICD-10-CM | POA: Diagnosis not present

## 2016-08-26 DIAGNOSIS — Z1211 Encounter for screening for malignant neoplasm of colon: Secondary | ICD-10-CM | POA: Diagnosis not present

## 2016-08-31 DIAGNOSIS — E1122 Type 2 diabetes mellitus with diabetic chronic kidney disease: Secondary | ICD-10-CM | POA: Diagnosis not present

## 2016-08-31 DIAGNOSIS — Z299 Encounter for prophylactic measures, unspecified: Secondary | ICD-10-CM | POA: Diagnosis not present

## 2016-08-31 DIAGNOSIS — N182 Chronic kidney disease, stage 2 (mild): Secondary | ICD-10-CM | POA: Diagnosis not present

## 2016-08-31 DIAGNOSIS — J069 Acute upper respiratory infection, unspecified: Secondary | ICD-10-CM | POA: Diagnosis not present

## 2016-08-31 DIAGNOSIS — Z789 Other specified health status: Secondary | ICD-10-CM | POA: Diagnosis not present

## 2017-05-09 ENCOUNTER — Encounter: Payer: Self-pay | Admitting: Gastroenterology

## 2017-07-13 ENCOUNTER — Other Ambulatory Visit: Payer: Self-pay

## 2017-07-13 ENCOUNTER — Encounter: Payer: Self-pay | Admitting: Gastroenterology

## 2017-07-13 ENCOUNTER — Telehealth: Payer: Self-pay

## 2017-07-13 ENCOUNTER — Ambulatory Visit (INDEPENDENT_AMBULATORY_CARE_PROVIDER_SITE_OTHER): Payer: Self-pay | Admitting: Gastroenterology

## 2017-07-13 VITALS — BP 138/72 | HR 94 | Temp 97.7°F | Ht 66.0 in | Wt 191.0 lb

## 2017-07-13 DIAGNOSIS — Z86010 Personal history of colonic polyps: Secondary | ICD-10-CM

## 2017-07-13 NOTE — Telephone Encounter (Addendum)
Gastroenterology Pre-Procedure Review  Request Date: 07/13/2017 Requesting Physician: ON RECALL   ( PCP IS Dr. Manuella Ghazi)  PATIENT REVIEW QUESTIONS: The patient responded to the following health history questions as indicated:    Hx of tubular adenoma ( last colonoscopy 06/15/2012 by Dr. Oneida Alar)  1. Diabetes Melitis: YES 2. Joint replacements in the past 12 months: no 3. Major health problems in the past 3 months: no 4. Has an artificial valve or MVP: no 5. Has a defibrillator: no 6. Has been advised in past to take antibiotics in advance of a procedure like teeth cleaning: no 7. Family history of colon cancer: no ( Grandfather in 84's) 61. Alcohol Use: no 9. History of sleep apnea:YES    HAS C PAP BUT DOES NOT USE IT 10. History of coronary artery or other vascular stents placed within the last 12 months: no 11. History of any prior anesthesia complications: no    MEDICATIONS & ALLERGIES:    Patient reports the following regarding taking any blood thinners:   Plavix? no Aspirin? YES Coumadin? no Brilinta? no Xarelto? no Eliquis? no Pradaxa? no Savaysa? no Effient? no  Patient confirms/reports the following medications:  Current Outpatient Prescriptions  Medication Sig Dispense Refill  . albuterol (PROVENTIL HFA;VENTOLIN HFA) 108 (90 BASE) MCG/ACT inhaler Inhale 2 puffs into the lungs every 6 (six) hours as needed. Asthma    . aspirin 81 MG tablet Take 81 mg by mouth daily.    . Cyanocobalamin (VITAMIN B 12 PO) Take 5,000 mcg by mouth daily.    . Fluticasone-Salmeterol (ADVAIR) 250-50 MCG/DOSE AEPB Inhale 1 puff into the lungs every 12 (twelve) hours.    Marland Kitchen lisinopril (PRINIVIL,ZESTRIL) 2.5 MG tablet Take 2.5 mg by mouth daily.    Marland Kitchen loratadine (CLARITIN) 10 MG tablet Take 10 mg by mouth daily.    . metFORMIN (GLUCOPHAGE) 500 MG tablet 2 tablets in the AM and 1 in the PM    . montelukast (SINGULAIR) 10 MG tablet Take 10 mg by mouth at bedtime.    . Multiple Vitamin (MULTIVITAMIN)  capsule Take 1 capsule by mouth daily.    . pantoprazole (PROTONIX) 40 MG tablet Take 40 mg by mouth every other day. Will be changing to zantac once this Rx is finished    . rosuvastatin (CRESTOR) 10 MG tablet Take 10 mg by mouth daily.    . theophylline (THEODUR) 300 MG 12 hr tablet Take 300 mg by mouth 2 (two) times daily.     . ranitidine (ZANTAC) 150 MG tablet Take 150 mg by mouth 2 (two) times daily. Will be starting this soon/ after completion of protonix     No current facility-administered medications for this visit.     Patient confirms/reports the following allergies:  Allergies  Allergen Reactions  . Tetracycline Itching    No orders of the defined types were placed in this encounter.   AUTHORIZATION INFORMATION Primary Insurance:   ID #:  Group #:  Pre-Cert / Auth required:  Pre-Cert / Auth #:   Secondary Insurance:   ID #:  Group #:  Pre-Cert / Auth required:  Pre-Cert / Auth #:   SCHEDULE INFORMATION: Procedure has been scheduled as follows:  Date: 08/26/2017              Time:  10:45 am Location: [facility] Short Stay  This Gastroenterology Pre-Precedure Review Form is being routed to the following provider(s): Dr. Oneida Alar

## 2017-07-14 NOTE — Progress Notes (Signed)
Opened in error

## 2017-08-05 NOTE — Telephone Encounter (Signed)
No metformin day of procedure. Appropriate.  

## 2017-08-08 MED ORDER — NA SULFATE-K SULFATE-MG SULF 17.5-3.13-1.6 GM/177ML PO SOLN: 1.0000 | ORAL | 0 refills | Status: DC

## 2017-08-08 NOTE — Telephone Encounter (Signed)
Rx sent to the pharmacy and instructions mailed to pt.  

## 2017-08-26 ENCOUNTER — Ambulatory Visit (HOSPITAL_COMMUNITY)
Admission: RE | Admit: 2017-08-26 | Discharge: 2017-08-26 | Disposition: A | Discharge status: Home / Self Care | Payer: Medicare Other | Source: Ambulatory Visit | Attending: Gastroenterology | Admitting: Gastroenterology

## 2017-08-26 ENCOUNTER — Encounter (HOSPITAL_COMMUNITY): Payer: Self-pay | Admitting: *Deleted

## 2017-08-26 ENCOUNTER — Encounter (HOSPITAL_COMMUNITY)
Admission: RE | Disposition: A | Discharge status: Home / Self Care | Payer: Self-pay | Source: Ambulatory Visit | Attending: Gastroenterology

## 2017-08-26 ENCOUNTER — Other Ambulatory Visit: Payer: Self-pay

## 2017-08-26 DIAGNOSIS — D120 Benign neoplasm of cecum: Secondary | ICD-10-CM | POA: Diagnosis not present

## 2017-08-26 DIAGNOSIS — K5730 Diverticulosis of large intestine without perforation or abscess without bleeding: Secondary | ICD-10-CM | POA: Diagnosis not present

## 2017-08-26 DIAGNOSIS — Z9841 Cataract extraction status, right eye: Secondary | ICD-10-CM | POA: Diagnosis not present

## 2017-08-26 DIAGNOSIS — E119 Type 2 diabetes mellitus without complications: Secondary | ICD-10-CM | POA: Diagnosis not present

## 2017-08-26 DIAGNOSIS — Z1211 Encounter for screening for malignant neoplasm of colon: Secondary | ICD-10-CM | POA: Diagnosis not present

## 2017-08-26 DIAGNOSIS — K648 Other hemorrhoids: Secondary | ICD-10-CM | POA: Insufficient documentation

## 2017-08-26 DIAGNOSIS — K219 Gastro-esophageal reflux disease without esophagitis: Secondary | ICD-10-CM | POA: Diagnosis not present

## 2017-08-26 DIAGNOSIS — D122 Benign neoplasm of ascending colon: Secondary | ICD-10-CM | POA: Insufficient documentation

## 2017-08-26 DIAGNOSIS — K449 Diaphragmatic hernia without obstruction or gangrene: Secondary | ICD-10-CM | POA: Diagnosis not present

## 2017-08-26 DIAGNOSIS — Z79899 Other long term (current) drug therapy: Secondary | ICD-10-CM | POA: Diagnosis not present

## 2017-08-26 DIAGNOSIS — Z7982 Long term (current) use of aspirin: Secondary | ICD-10-CM | POA: Diagnosis not present

## 2017-08-26 DIAGNOSIS — Z803 Family history of malignant neoplasm of breast: Secondary | ICD-10-CM | POA: Insufficient documentation

## 2017-08-26 DIAGNOSIS — K644 Residual hemorrhoidal skin tags: Secondary | ICD-10-CM | POA: Insufficient documentation

## 2017-08-26 DIAGNOSIS — E785 Hyperlipidemia, unspecified: Secondary | ICD-10-CM | POA: Insufficient documentation

## 2017-08-26 DIAGNOSIS — Z7984 Long term (current) use of oral hypoglycemic drugs: Secondary | ICD-10-CM | POA: Insufficient documentation

## 2017-08-26 DIAGNOSIS — Z800 Family history of malignant neoplasm of digestive organs: Secondary | ICD-10-CM | POA: Insufficient documentation

## 2017-08-26 DIAGNOSIS — D125 Benign neoplasm of sigmoid colon: Secondary | ICD-10-CM | POA: Diagnosis not present

## 2017-08-26 DIAGNOSIS — Z881 Allergy status to other antibiotic agents status: Secondary | ICD-10-CM | POA: Insufficient documentation

## 2017-08-26 DIAGNOSIS — Z9842 Cataract extraction status, left eye: Secondary | ICD-10-CM | POA: Insufficient documentation

## 2017-08-26 DIAGNOSIS — Z86010 Personal history of colonic polyps: Secondary | ICD-10-CM | POA: Diagnosis not present

## 2017-08-26 DIAGNOSIS — D123 Benign neoplasm of transverse colon: Secondary | ICD-10-CM | POA: Insufficient documentation

## 2017-08-26 DIAGNOSIS — Z860101 Personal history of adenomatous and serrated colon polyps: Secondary | ICD-10-CM

## 2017-08-26 DIAGNOSIS — J45909 Unspecified asthma, uncomplicated: Secondary | ICD-10-CM | POA: Insufficient documentation

## 2017-08-26 HISTORY — PX: POLYPECTOMY: SHX5525

## 2017-08-26 HISTORY — PX: COLONOSCOPY: SHX5424

## 2017-08-26 LAB — GLUCOSE, CAPILLARY: Glucose-Capillary: 153 mg/dL — ABNORMAL HIGH (ref 65–99)

## 2017-08-26 SURGERY — COLONOSCOPY
Anesthesia: Moderate Sedation

## 2017-08-26 MED ORDER — MEPERIDINE HCL 100 MG/ML IJ SOLN
INTRAMUSCULAR | Status: AC
  Filled 2017-08-26: qty 2

## 2017-08-26 MED ORDER — ATROPINE SULFATE 1 MG/ML IJ SOLN
INTRAMUSCULAR | Status: AC
  Filled 2017-08-26: qty 1

## 2017-08-26 MED ORDER — SODIUM CHLORIDE 0.9 % IV SOLN
INTRAVENOUS | Status: DC
  Administered 2017-08-26: 11:00:00 via INTRAVENOUS

## 2017-08-26 MED ORDER — ATROPINE SULFATE 1 MG/ML IJ SOLN
INTRAMUSCULAR | Status: DC | PRN
  Administered 2017-08-26: .5 mg via INTRAVENOUS

## 2017-08-26 MED ORDER — MIDAZOLAM HCL 5 MG/5ML IJ SOLN
INTRAMUSCULAR | Status: AC
  Filled 2017-08-26: qty 10

## 2017-08-26 MED ORDER — MEPERIDINE HCL 100 MG/ML IJ SOLN
INTRAMUSCULAR | Status: DC | PRN
  Administered 2017-08-26 (×2): 25 mg via INTRAVENOUS

## 2017-08-26 MED ORDER — STERILE WATER FOR IRRIGATION IR SOLN
Status: DC | PRN
  Administered 2017-08-26: 2.5 mL

## 2017-08-26 MED ORDER — MIDAZOLAM HCL 5 MG/5ML IJ SOLN
INTRAMUSCULAR | Status: DC | PRN
  Administered 2017-08-26 (×2): 2 mg via INTRAVENOUS

## 2017-08-26 NOTE — Discharge Instructions (Signed)
You had 8 polyps removed. You have diverticulosis in your right and left colon. You have MODERATE EXTERNAL AND internal hemorrhoids.   DRINK WATER TO KEEP YOUR URINE LIGHT YELLOW.  FOLLOW A HIGH FIBER DIET. AVOID ITEMS THAT CAUSE BLOATING & GAS. SEE INFO BELOW.  YOUR BIOPSY RESULTS WILL BE AVAILABLE IN MY CHART AFTER DEC 4 AND MY OFFICE WILL CONTACT YOU IN 10-14 DAYS WITH YOUR RESULTS.   Next colonoscopy in 3 years.    Colonoscopy Care After Read the instructions outlined below and refer to this sheet in the next week. These discharge instructions provide you with general information on caring for yourself after you leave the hospital. While your treatment has been planned according to the most current medical practices available, unavoidable complications occasionally occur. If you have any problems or questions after discharge, call DR. Diar Berkel, [phone].  ACTIVITY  You may resume your regular activity, but move at a slower pace for the next 24 hours.   Take frequent rest periods for the next 24 hours.   Walking will help get rid of the air and reduce the bloated feeling in your belly (abdomen).   No driving for 24 hours (because of the medicine (anesthesia) used during the test).   You may shower.   Do not sign any important legal documents or operate any machinery for 24 hours (because of the anesthesia used during the test).    NUTRITION  Drink plenty of fluids.   You may resume your normal diet as instructed by your doctor.   Begin with a light meal and progress to your normal diet. Heavy or fried foods are harder to digest and may make you feel sick to your stomach (nauseated).   Avoid alcoholic beverages for 24 hours or as instructed.    MEDICATIONS  You may resume your normal medications.   WHAT YOU CAN EXPECT TODAY  Some feelings of bloating in the abdomen.   Passage of more gas than usual.   Spotting of blood in your stool or on the toilet paper  .   IF YOU HAD POLYPS REMOVED DURING THE COLONOSCOPY:  Eat a soft diet IF YOU HAVE NAUSEA, BLOATING, ABDOMINAL PAIN, OR VOMITING.    FINDING OUT THE RESULTS OF YOUR TEST Not all test results are available during your visit. DR. Oneida Alar WILL CALL YOU WITHIN 14 DAYS OF YOUR PROCEDUE WITH YOUR RESULTS. Do not assume everything is normal if you have not heard from DR. Kanen Mottola, CALL HER OFFICE AT [phone].  SEEK IMMEDIATE MEDICAL ATTENTION AND CALL THE OFFICE: [phone] IF:  You have more than a spotting of blood in your stool.   Your belly is swollen (abdominal distention).   You are nauseated or vomiting.   You have a temperature over 101F.   You have abdominal pain or discomfort that is severe or gets worse throughout the day.   High-Fiber Diet A high-fiber diet changes your normal diet to include more whole grains, legumes, fruits, and vegetables. Changes in the diet involve replacing refined carbohydrates with unrefined foods. The calorie level of the diet is essentially unchanged. The Dietary Reference Intake (recommended amount) for adult males is 38 grams per day. For adult females, it is 25 grams per day. Pregnant and lactating women should consume 28 grams of fiber per day. Fiber is the intact part of a plant that is not broken down during digestion. Functional fiber is fiber that has been isolated from the plant to provide a beneficial effect  in the body. PURPOSE  Increase stool bulk.   Ease and regulate bowel movements.   Lower cholesterol.   REDUCE RISK OF COLON CANCER  INDICATIONS THAT YOU NEED MORE FIBER  Constipation and hemorrhoids.   Uncomplicated diverticulosis (intestine condition) and irritable bowel syndrome.   Weight management.   As a protective measure against hardening of the arteries (atherosclerosis), diabetes, and cancer.   GUIDELINES FOR INCREASING FIBER IN THE DIET  Start adding fiber to the diet slowly. A gradual increase of about 5 more  grams (2 slices of whole-wheat bread, 2 servings of most fruits or vegetables, or 1 bowl of high-fiber cereal) per day is best. Too rapid an increase in fiber may result in constipation, flatulence, and bloating.   Drink enough water and fluids to keep your urine clear or pale yellow. Water, juice, or caffeine-free drinks are recommended. Not drinking enough fluid may cause constipation.   Eat a variety of high-fiber foods rather than one type of fiber.   Try to increase your intake of fiber through using high-fiber foods rather than fiber pills or supplements that contain small amounts of fiber.   The goal is to change the types of food eaten. Do not supplement your present diet with high-fiber foods, but replace foods in your present diet.   INCLUDE A VARIETY OF FIBER SOURCES  Replace refined and processed grains with whole grains, canned fruits with fresh fruits, and incorporate other fiber sources. White rice, white breads, and most bakery goods contain little or no fiber.   Brown whole-grain rice, buckwheat oats, and many fruits and vegetables are all good sources of fiber. These include: broccoli, Brussels sprouts, cabbage, cauliflower, beets, sweet potatoes, white potatoes (skin on), carrots, tomatoes, eggplant, squash, berries, fresh fruits, and dried fruits.   Cereals appear to be the richest source of fiber. Cereal fiber is found in whole grains and bran. Bran is the fiber-rich outer coat of cereal grain, which is largely removed in refining. In whole-grain cereals, the bran remains. In breakfast cereals, the largest amount of fiber is found in those with "bran" in their names. The fiber content is sometimes indicated on the label.   You may need to include additional fruits and vegetables each day.   In baking, for 1 cup white flour, you may use the following substitutions:   1 cup whole-wheat flour minus 2 tablespoons.   1/2 cup white flour plus 1/2 cup whole-wheat flour.    Polyps, Colon  A polyp is extra tissue that grows inside your body. Colon polyps grow in the large intestine. The large intestine, also called the colon, is part of your digestive system. It is a long, hollow tube at the end of your digestive tract where your body makes and stores stool. Most polyps are not dangerous. They are benign. This means they are not cancerous. But over time, some types of polyps can turn into cancer. Polyps that are smaller than a pea are usually not harmful. But larger polyps could someday become or may already be cancerous. To be safe, doctors remove all polyps and test them.   WHO GETS POLYPS? Anyone can get polyps, but certain people are more likely than others. You may have a greater chance of getting polyps if:  You are over 50.   You have had polyps before.   Someone in your family has had polyps.   Someone in your family has had cancer of the large intestine.   Find out if someone  in your family has had polyps. You may also be more likely to get polyps if you:   Eat a lot of fatty foods   Smoke   Drink alcohol   Do not exercise  Eat too much   PREVENTION There is not one sure way to prevent polyps. You might be able to lower your risk of getting them if you:  Eat more fruits and vegetables and less fatty food.   Do not smoke.   Avoid alcohol.   Exercise every day.   Lose weight if you are overweight.  Eating more calcium and folate can also lower your risk of getting polyps. Some foods that are rich in calcium are milk, cheese, and broccoli. Some foods that are rich in folate are chickpeas, kidney beans, and spinach.  Diverticulosis Diverticulosis is a common condition that develops when small pouches (diverticula) form in the wall of the colon. The risk of diverticulosis increases with age. It happens more often in people who eat a low-fiber diet. Most individuals with diverticulosis have no symptoms. Those individuals with symptoms usually  experience belly (abdominal) pain, constipation, or loose stools (diarrhea).  HOME CARE INSTRUCTIONS  Increase the amount of fiber in your diet as directed by your caregiver or dietician. This may reduce symptoms of diverticulosis.   Drink at least 6 to 8 glasses of water each day to prevent constipation.   Try not to strain when you have a bowel movement.   Avoiding nuts and seeds to prevent complications is NOT NECESSARY.   FOODS HAVING HIGH FIBER CONTENT INCLUDE:  Fruits. Apple, peach, pear, tangerine, raisins, prunes.   Vegetables. Brussels sprouts, asparagus, broccoli, cabbage, carrot, cauliflower, romaine lettuce, spinach, summer squash, tomato, winter squash, zucchini.   Starchy Vegetables. Baked beans, kidney beans, lima beans, split peas, lentils, potatoes (with skin).   Grains. Whole wheat bread, brown rice, bran flake cereal, plain oatmeal, white rice, shredded wheat, bran muffins.    SEEK IMMEDIATE MEDICAL CARE IF:  You develop increasing pain or severe bloating.   You have an oral temperature above 101F.   You develop vomiting or bowel movements that are bloody or black.

## 2017-08-26 NOTE — H&P (Signed)
Primary Care Physician:  Monico Blitz, MD Primary Gastroenterologist:  Dr. Oneida Alar  Pre-Procedure History & Physical: HPI:  Edwin Burgess is a 71 y.o. male here for  PERSONAL HISTORY OF POLYPS.  Past Medical History:  Diagnosis Date  . Asthma   . DM (diabetes mellitus) ([place])   . GERD (gastroesophageal reflux disease)   . Hyperlipidemia   . Personal history of colonic polyps     Past Surgical History:  Procedure Laterality Date  . CATARACT EXTRACTION, BILATERAL    . COLONOSCOPY  Aug 12/2005   dr.fleishman/mild diverticulosis. recommended to have next TCS 2012.  Marland Kitchen COLONOSCOPY  06/15/2012   [facility]:VELFY internal hemorrhoids/ Mild diverticulosis/Eight sessile polyps measuring 2-6 mm (tubular adenomas). Surveillance in 5 years due to multiple adenomas  . ESOPHAGOGASTRODUODENOSCOPY  07/06/10   tight schatzki ring s/p dilation/small hiatal hernia    Prior to Admission medications   Medication Sig Start Date End Date Taking? Authorizing Provider  aspirin 81 MG tablet Take 81 mg by mouth daily.   Yes [provider]  Cyanocobalamin ([facility] B-12) 1500 MCG TBDP Take 1,500 mcg by mouth daily.   Yes [provider]  Fluticasone-Salmeterol (ADVAIR) 250-50 MCG/DOSE AEPB Inhale 1 puff into the lungs every 12 (twelve) hours.   Yes [provider]  lisinopril (PRINIVIL,ZESTRIL) 2.5 MG tablet Take 2.5 mg by mouth daily.   Yes [provider]  loratadine (CLARITIN) 10 MG tablet Take 10 mg by mouth daily.   Yes [provider]  metFORMIN (GLUCOPHAGE) 500 MG tablet Take 500-1,000 mg by mouth See admin instructions. Take 1000 mg in the morning and 500 in the evening 10/13/11  Yes [provider]  montelukast (SINGULAIR) 10 MG tablet Take 10 mg by mouth at bedtime.   Yes [provider]  Multiple Vitamin (MULTIVITAMIN) capsule Take 1 capsule by mouth daily.   Yes [provider]  Na Sulfate-K Sulfate-Mg Sulf (SUPREP BOWEL PREP KIT)  17.5-3.13-1.6 GM/177ML SOLN Take 1 kit as directed by mouth. 08/08/17  Yes Annitta Needs, NP  ranitidine (ZANTAC) 150 MG tablet Take 150 mg by mouth 2 (two) times daily.    Yes [provider]  rosuvastatin (CRESTOR) 10 MG tablet Take 10 mg by mouth every evening.    Yes [provider]  theophylline (THEODUR) 300 MG 12 hr tablet Take 300 mg by mouth 2 (two) times daily.  10/19/11  Yes [provider]  albuterol (PROVENTIL HFA;VENTOLIN HFA) 108 (90 BASE) MCG/ACT inhaler Inhale 2 puffs into the lungs every 6 (six) hours as needed. Asthma    [provider]  olopatadine (PATANOL) 0.1 % ophthalmic solution Place 1 drop into both eyes daily as needed for allergies.    [provider]    Allergies as of 07/13/2017 - Review Complete 07/13/2017  Allergen Reaction Noted  . Tetracycline Itching     Family History  Problem Relation Age of Onset  . Colon cancer Maternal Grandfather 91  . Breast cancer Maternal Aunt     Social History   Socioeconomic History  . Marital status: Married    Spouse name: Not on file  . Number of children: 2  . Years of education: Not on file  . Highest education level: Not on file  Social Needs  . Financial resource strain: Not on file  . Food insecurity - worry: Not on file  . Food insecurity - inability: Not on file  . Transportation needs - medical: Not on file  . Transportation  needs - non-medical: Not on file  Occupational History  . Occupation: [employer]  . Occupation: retired Education officer, museum  Tobacco Use  . Smoking status: Never Smoker  . Smokeless tobacco: Never Used  Substance and Sexual Activity  . Alcohol use: No  . Drug use: No  . Sexual activity: Not on file  Other Topics Concern  . Not on file  Social History Narrative  . Not on file    Review of Systems: See HPI, otherwise negative ROS   Physical Exam: BP 138/74   Pulse 81   Temp 97.9 F (36.6 C) (Oral)   Resp 14   Ht _0   (1.676 m)   Wt 182 lb (82.6 kg)   SpO2 99%   BMI 29.38 kg/m  General:   Alert,  pleasant and cooperative in NAD Head:  Normocephalic and atraumatic. Neck:  Supple; Lungs:  Clear throughout to auscultation.    Heart:  Regular rate and rhythm. Abdomen:  Soft, nontender and nondistended. Normal bowel sounds, without guarding, and without rebound.   Neurologic:  Alert and  oriented x4;  grossly normal neurologically.  Impression/Plan:    PERSONAL HISTORY OF POLYPS.  PLAN:  1. TCS TODAY DISCUSSED PROCEDURE, BENEFITS, & RISKS: < 1% chance of medication reaction, bleeding, perforation, or rupture of spleen/liver.

## 2017-08-26 NOTE — Op Note (Signed)
[facility] Patient Name: Edwin Burgess Procedure Date: 08/26/2017 10:54 AM MRN: [id number] Date of Birth: 1946/03/08 Attending MD: Barney Drain MD, MD CSN: [id number] Age: 71 Admit Type: Outpatient Procedure:                Colonoscopy WITH COLD FORCEPS/SNARE POLYPECTOMY Indications:              Personal history of colonic polyps Providers:                Barney Drain MD, MD, Janeece Riggers, RN, Lurline Del, RN Referring MD:             Fuller Canada Manuella Ghazi MD, MD Medicines:                Meperidine 50 mg IV, Midazolam 4 mg IV, Atropine                            0.5 mg IV Complications:            Vasovagal reaction: HR 40S, SBP 66-85. GIVEN                            ATROPINE 0.5 MG IV. HR INCREASED TO 90 AND SBP > 100 Estimated Blood Loss:     Estimated blood loss was minimal. Procedure:                Pre-Anesthesia Assessment:                           - Prior to the procedure, a History and Physical                            was performed, and patient medications and                            allergies were reviewed. The patient's tolerance of                            previous anesthesia was also reviewed. The risks                            and benefits of the procedure and the sedation                            options and risks were discussed with the patient.                            All questions were answered, and informed consent                            was obtained. Prior Anticoagulants: The patient has                            taken aspirin, last dose was 1 day prior to                            procedure. ASA Grade Assessment:  II - A patient                            with mild systemic disease. After reviewing the                            risks and benefits, the patient was deemed in                            satisfactory condition to undergo the procedure.                            After obtaining informed consent, the colonoscope          was passed under direct vision. Throughout the                            procedure, the patient's blood pressure, pulse, and                            oxygen saturations were monitored continuously. The                            EC-[id number] ([id number]) scope was introduced through                            the anus and advanced to the the cecum, identified                            by appendiceal orifice and ileocecal valve. The                            patient tolerated the procedure well. The quality                            of the bowel preparation was excellent. The                            ileocecal valve, appendiceal orifice, and rectum                            were photographed. The colonoscopy was somewhat                            difficult due to a tortuous colon. Successful                            completion of the procedure was aided by COLOWRAP. Scope In: 11:26:22 AM Scope Out: 11:57:56 AM Scope Withdrawal Time: 0 hours 29 minutes 7 seconds  Total Procedure Duration: 0 hours 31 minutes 34 seconds  Findings:      A 2 mm polyp was found in the ascending colon. The polyp was sessile.       The polyp was removed with a cold biopsy forceps. Resection and       retrieval  were complete.      Seven sessile polyps were found in the sigmoid colon, transverse colon       and cecum. The polyps were 3 to 6 mm in size. These polyps were removed       with a cold snare. Resection and retrieval were complete.      Multiple small and large-mouthed diverticula were found in the       recto-sigmoid colon, sigmoid colon, descending colon and cecum.      External and internal hemorrhoids were found during retroflexion. The       hemorrhoids were moderate. Impression:               - One 2 mm polyp in the ascending colon, removed                            with a cold biopsy forceps. Resected and retrieved.                           - Seven 3 to 6 mm polyps in the sigmoid  colon(3),                            in the transverse colon(3) and in the cecum,                            removed with a cold snare. Resected and retrieved.                           - Diverticulosis in the recto-sigmoid colon, in the                            sigmoid colon, in the descending colon and in the                            cecum.                           - External and internal hemorrhoids. Moderate Sedation:      Moderate (conscious) sedation was administered by the endoscopy nurse       and supervised by the endoscopist. The following parameters were       monitored: oxygen saturation, heart rate, blood pressure, and response       to care. Total physician intraservice time was 39 minutes. Recommendation:           - Repeat colonoscopy in 3 years for surveillance.                           - High fiber diet.                           - Continue present medications.                           - Await pathology results.                           - Patient  has a contact number available for                            emergencies. The signs and symptoms of potential                            delayed complications were discussed with the                            patient. Return to normal activities tomorrow.                            Written discharge instructions were provided to the                            patient. Procedure Code(s):        --- Professional ---                           [phone], Colonoscopy, flexible; with removal of                            tumor(s), polyp(s), or other lesion(s) by snare                            technique                           45380, 59, Colonoscopy, flexible; with biopsy,                            single or multiple                           99152, Moderate sedation services provided by the                            same physician or other qualified health care                            professional performing the  diagnostic or                            therapeutic service that the sedation supports,                            requiring the presence of an independent trained                            observer to assist in the monitoring of the                            patient's level of consciousness and physiological                            status; initial  15 minutes of intraservice time,                            patient age 1 years or older                           [phone], Moderate sedation services; each additional                            15 minutes intraservice time                           [phone], Moderate sedation services; each additional                            15 minutes intraservice time Diagnosis Code(s):        --- Professional ---                           D12.2, Benign neoplasm of ascending colon                           D12.5, Benign neoplasm of sigmoid colon                           D12.3, Benign neoplasm of transverse colon (hepatic                            flexure or splenic flexure)                           D12.0, Benign neoplasm of cecum                           K64.8, Other hemorrhoids                           Z86.010, Personal history of colonic polyps                           K57.30, Diverticulosis of large intestine without                            perforation or abscess without bleeding CPT copyright 2016 American Medical Association. All rights reserved. The codes documented in this report are preliminary and upon coder review may  be revised to meet current compliance requirements. Barney Drain, MD Barney Drain MD, MD 08/26/2017 12:10:21 PM This report has been signed electronically. Number of Addenda: 0

## 2017-08-29 ENCOUNTER — Encounter (HOSPITAL_COMMUNITY): Payer: Self-pay | Admitting: Gastroenterology

## 2019-06-06 ENCOUNTER — Encounter: Payer: Self-pay | Admitting: Nutrition

## 2019-06-06 ENCOUNTER — Other Ambulatory Visit: Payer: Self-pay

## 2019-06-06 ENCOUNTER — Encounter: Payer: Medicare Other | Attending: Internal Medicine | Admitting: Nutrition

## 2019-06-06 DIAGNOSIS — E1165 Type 2 diabetes mellitus with hyperglycemia: Secondary | ICD-10-CM | POA: Diagnosis present

## 2019-06-06 DIAGNOSIS — E118 Type 2 diabetes mellitus with unspecified complications: Secondary | ICD-10-CM | POA: Insufficient documentation

## 2019-06-06 DIAGNOSIS — IMO0002 Reserved for concepts with insufficient information to code with codable children: Secondary | ICD-10-CM

## 2019-06-06 NOTE — Progress Notes (Signed)
  Medical Nutrition Therapy:  Appt start time: 0900 end time:  1000.   Assessment:  Primary concerns today: Diabetes Type 2. Lives with his wife who has DM also. He admist to eating 2 meals per day. Usually skips breakfast, eats nabls and applesauce for lunch and then dinner. Does yardwork and stays active outside. He reports his A1C was 6.9%. Metformin 500 mg BID.  Sees Dr. Manuella Ghazi in [place], [place]. Marland Kitchen   Preferred Learning Style:     No preference indicated   Learning Readiness:   Ready  Change in progress   MEDICATIONS: none   DIETARY INTAKE:  Eats 1-2 meals per day. Nabs and applssauce for lunch Skips breakfast at times and eats dinner and snacks some.   Usual physical activity: yard work  Estimated energy needs: 1500  calories 170 g carbohydrates 112 g protein 42 g fat  Progress Towards Goal(s):  In progress.   Nutritional Diagnosis:  NB-1.1 Food and nutrition-related knowledge deficit As related to Diabetes Type 2, .  As evidenced by A1c 6.9%..    Intervention:  Nutrition and Diabetes education provided on My Plate, CHO counting, meal planning, portion sizes, timing of meals, avoiding snacks between meals unless having a low blood sugar, target ranges for A1C and blood sugars, signs/symptoms and treatment of hyper/hypoglycemia, monitoring blood sugars, taking medications as prescribed, benefits of exercising 30 minutes per day and prevention of complications of DM.  Goals Follow My Plate Eat meals on time Watch portions Don't skip meals  Drink water  Exercise 60 minutes 3-4 times per week. Get A1C to 6% or less.  Teaching Method Utilized: Visual Auditory Hands on  Handouts given during visit include:  The Plate Method   Meal Plan  Diabetes Instructions     Barriers to learning/adherence to lifestyle change  Demonstrated degree of understanding via:  Teach Back   Monitoring/Evaluation:  Dietary intake, exercise,  and body weight in 1 month(s).

## 2019-06-06 NOTE — Patient Instructions (Signed)
Goals Follow My Plate Eat meals on time Watch portions Don't skip meals  Drink water  Exercise 60 minutes 3-4 times per week. Get A1C to 6% or less.

## 2019-08-09 ENCOUNTER — Encounter: Payer: Self-pay | Admitting: Nutrition

## 2019-08-09 ENCOUNTER — Encounter: Payer: Medicare Other | Attending: Internal Medicine | Admitting: Nutrition

## 2019-08-09 ENCOUNTER — Other Ambulatory Visit: Payer: Self-pay

## 2019-08-09 VITALS — Ht 66.0 in | Wt 186.6 lb

## 2019-08-09 DIAGNOSIS — E782 Mixed hyperlipidemia: Secondary | ICD-10-CM | POA: Insufficient documentation

## 2019-08-09 DIAGNOSIS — E1165 Type 2 diabetes mellitus with hyperglycemia: Secondary | ICD-10-CM | POA: Diagnosis present

## 2019-08-09 DIAGNOSIS — E118 Type 2 diabetes mellitus with unspecified complications: Secondary | ICD-10-CM | POA: Insufficient documentation

## 2019-08-09 DIAGNOSIS — IMO0002 Reserved for concepts with insufficient information to code with codable children: Secondary | ICD-10-CM

## 2019-08-09 NOTE — Progress Notes (Signed)
  Medical Nutrition Therapy:  Appt start time: [id number] end time:  1500  Assessment:  Primary concerns today: Diabetes Type 2. Lives with his wife who has DM also.  He has been working on eating better balanced meals. Drinking more water Been trying to be more active. FBS: 130-160's in am  Metformin 1000 in am and 500 mg  in pm. A1C 6.8% down from 6.9%. Vitals with BMI 08/09/2019  Height 5\' 6"   Weight 186 lbs 10 oz  BMI 123XX123  Systolic   Diastolic       Preferred Learning Style:     No preference indicated   Learning Readiness:   Ready  Change in progress   MEDICATIONS: none   DIETARY INTAKE:  Eats 1-2 meals per day. Nabs and applssauce for lunch Skips breakfast at times and eats dinner and snacks some.   Usual physical activity: yard work  Estimated energy needs: 1500  calories 170 g carbohydrates 112 g protein 42 g fat  Progress Towards Goal(s):  In progress.   Nutritional Diagnosis:  NB-1.1 Food and nutrition-related knowledge deficit As related to Diabetes Type 2, .  As evidenced by A1c 6.9%..    Intervention:  Nutrition and Diabetes education provided on My Plate, CHO counting, meal planning, portion sizes, timing of meals, avoiding snacks between meals unless having a low blood sugar, target ranges for A1C and blood sugars, signs/symptoms and treatment of hyper/hypoglycemia, monitoring blood sugars, taking medications as prescribed, benefits of exercising 30 minutes per day and prevention of complications of DM.  Goals  Goals  Walk 3 days per week Cut out grapefruit Drink more water-5 bottles per day Get A1C  To 6.6% Teaching Method Utilized: Visual Auditory Hands on  Handouts given during visit include:  The Plate Method   Meal Plan  Diabetes Instructions     Barriers to learning/adherence to lifestyle change  Demonstrated degree of understanding via:  Teach Back   Monitoring/Evaluation:  Dietary intake, exercise,  and body weight in 3  month(s).

## 2019-08-09 NOTE — Patient Instructions (Signed)
Goals  Walk 3 days per week Cut out grapefruit Drink more water-5 bottles per day Get A1C  To 6.6%

## 2019-12-04 NOTE — Progress Notes (Addendum)
REVIEWED-NO ADDITIONAL RECOMMENDATIONS.  Referring Provider: Glenda Chroman, MD Primary Care Physician:  Glenda Chroman, MD  Primary GI: Dr. Oneida Alar   Chief Complaint  Patient presents with  . Dysphagia    trouble swallowing pills    HPI:   Edwin Burgess is a 74 y.o. male presenting today with a history of multiple colon polyps, due for surveillance in Nov 2021, now with dysphagia.   Notes 3 week history of pill dysphagia, feeling lodged just below sternum. Has had to regurgitate pills as well. Pills are small. Last EGD/dilation in 2011. Last one to finish eating. Chewing food double time, which goes down ok. No PPI. No abdominal pain. No weight loss or lack of appetite. No constipation or diarrhea. No rectal bleeding. Wants EGD. Concerned about stricture.   Past Medical History:  Diagnosis Date  . Asthma   . DM (diabetes mellitus) ([place])   . GERD (gastroesophageal reflux disease)   . Hyperlipidemia   . Personal history of colonic polyps     Past Surgical History:  Procedure Laterality Date  . CATARACT EXTRACTION, BILATERAL    . COLONOSCOPY  Aug 12/2005   dr.fleishman/mild diverticulosis. recommended to have next TCS 2012.  Marland Kitchen COLONOSCOPY  06/15/2012   [id number] internal hemorrhoids/ Mild diverticulosis/Eight sessile polyps measuring 2-6 mm (tubular adenomas). Surveillance in 5 years due to multiple adenomas  . COLONOSCOPY N/A 08/26/2017   One 2 mm polyps in ascending colon, seven 3-6 mm polyps in sigmoid, transverse, and cecum, diverticulosis in rectosigmoid, descending, and cecum, external and internal hemorrhoids. (seven simple adenomas and one hyperplastic polyp). 3 year surveillance   . ESOPHAGOGASTRODUODENOSCOPY  07/06/10   tight schatzki ring s/p dilation/small hiatal hernia  . POLYPECTOMY  08/26/2017   Procedure: POLYPECTOMY;  Surgeon: Danie Binder, MD;  [facility];  Service: Endoscopy;;  cecal; transverse colon; ascending colon     Current  Outpatient Medications  Medication Sig Dispense Refill  . albuterol (PROVENTIL HFA;VENTOLIN HFA) 108 (90 BASE) MCG/ACT inhaler Inhale 2 puffs into the lungs every 6 (six) hours as needed. Asthma    . amoxicillin (AMOXIL) 250 MG chewable tablet Chew 1 tablet by mouth in the morning, at noon, in the evening, and at bedtime. x7 days    . aspirin 81 MG tablet Take 81 mg by mouth daily.    . Cholecalciferol (VITAMIN D3) 125 MCG (5000 UT) TABS Take by mouth daily.    . Cyanocobalamin (VITAMIN B-12) 5000 MCG TBDP Take by mouth daily.    . Fluticasone-Salmeterol (ADVAIR) 250-50 MCG/DOSE AEPB Inhale 1 puff into the lungs every 12 (twelve) hours.    Marland Kitchen lisinopril (PRINIVIL,ZESTRIL) 2.5 MG tablet Take 2.5 mg by mouth daily.    Marland Kitchen loratadine (CLARITIN) 10 MG tablet Take 10 mg by mouth daily.    . metFORMIN (GLUCOPHAGE) 500 MG tablet Take 500-1,000 mg by mouth See admin instructions. Take 1000 mg in the morning and 500 in the evening    . montelukast (SINGULAIR) 10 MG tablet Take 10 mg by mouth at bedtime.    . Multiple Vitamin (MULTIVITAMIN) capsule Take 1 capsule by mouth daily.    Marland Kitchen olopatadine (PATANOL) 0.1 % ophthalmic solution Place 1 drop into both eyes daily as needed for allergies.    . rosuvastatin (CRESTOR) 10 MG tablet Take 10 mg by mouth every evening.     . theophylline (THEODUR) 300 MG 12 hr tablet Take 300 mg by mouth 2 (two) times daily.     Marland Kitchen  Cyanocobalamin ([employer] B-12) 1500 MCG TBDP Take 1,500 mcg by mouth daily.    . ranitidine (ZANTAC) 150 MG tablet Take 150 mg by mouth 2 (two) times daily.      No current facility-administered medications for this visit.    Allergies as of 12/05/2019 - Review Complete 12/05/2019  Allergen Reaction Noted  . Tetracycline Itching     Family History  Problem Relation Age of Onset  . Colon cancer Maternal Grandfather 91  . Breast cancer Maternal Aunt     Social History   Socioeconomic History  . Marital status: Married    Spouse name: Not on file   . Number of children: 2  . Years of education: Not on file  . Highest education level: Not on file  Occupational History  . Occupation: [employer]  . Occupation: retired Education officer, museum  Tobacco Use  . Smoking status: Never Smoker  . Smokeless tobacco: Never Used  Substance and Sexual Activity  . Alcohol use: No  . Drug use: No  . Sexual activity: Not on file  Other Topics Concern  . Not on file  Social History Narrative  . Not on file   Social Determinants of Health   Financial Resource Strain:   . Difficulty of Paying Living Expenses: Not on file  Food Insecurity:   . Worried About Charity fundraiser in the Last Year: Not on file  . Ran Out of Food in the Last Year: Not on file  Transportation Needs:   . Lack of Transportation (Medical): Not on file  . Lack of Transportation (Non-Medical): Not on file  Physical Activity:   . Days of Exercise per Week: Not on file  . Minutes of Exercise per Session: Not on file  Stress:   . Feeling of Stress : Not on file  Social Connections:   . Frequency of Communication with Friends and Family: Not on file  . Frequency of Social Gatherings with Friends and Family: Not on file  . Attends Religious Services: Not on file  . Active Member of Clubs or Organizations: Not on file  . Attends Archivist Meetings: Not on file  . Marital Status: Not on file    Review of Systems: Gen: Denies fever, chills, anorexia. Denies fatigue, weakness, weight loss.  CV: Denies chest pain, palpitations, syncope, peripheral edema, and claudication. Resp: Denies dyspnea at rest, cough, wheezing, coughing up blood, and pleurisy. GI: see HPI Derm: Denies rash, itching, dry skin Psych: Denies depression, anxiety, memory loss, confusion. No homicidal or suicidal ideation.  Heme: Denies bruising, bleeding, and enlarged lymph nodes.  Physical Exam: BP (!) 153/76   Pulse 81   Temp (!) 96.9 F (36.1 C) (Temporal)   Ht 5\' 6"  (1.676 m)    Wt 186 lb 12.8 oz (84.7 kg)   BMI 30.15 kg/m  General:   Alert and oriented. No distress noted. Pleasant and cooperative.  Head:  Normocephalic and atraumatic. Eyes:  Conjuctiva clear without scleral icterus. Lungs: clear bilaterally Cardiac: S1 S2 present without murmurs Abdomen:  +BS, soft, non-tender and non-distended. No rebound or guarding. No HSM or masses noted. Msk:  Symmetrical without gross deformities. Normal posture. Extremities:  Without edema. Neurologic:  Alert and  oriented x4 Psych:  Alert and cooperative. Normal mood and affect.  ASSESSMENT: SHAWNEE PETZOLD is a 74 y.o. male presenting today with 3 week history of pill dysphagia, no obvious solid food dysphagia but has been purposefully chewing well, small bites, and  making behavior changes. History significant for Schatzki's ring s/p dilation in 2011. Currently not on a PPI. He desires to pursue EGD/dilation in near future. We discussed possibly needing BPE if EGD unrevealing.      PLAN:   Proceed with upper endoscopy/dilation in the near future with Dr. Oneida Alar. The risks, benefits, and alternatives have been discussed in detail with patient. They have stated understanding and desire to proceed.   Start Protonix once daily  Return in 4-6 months  Next colonoscopy Nov 2021  Annitta Needs, PhD, [facility] Gastroenterology

## 2019-12-05 ENCOUNTER — Telehealth: Payer: Self-pay | Admitting: *Deleted

## 2019-12-05 ENCOUNTER — Encounter: Payer: Self-pay | Admitting: Gastroenterology

## 2019-12-05 ENCOUNTER — Other Ambulatory Visit: Payer: Self-pay

## 2019-12-05 ENCOUNTER — Ambulatory Visit: Payer: Medicare PPO | Admitting: Gastroenterology

## 2019-12-05 ENCOUNTER — Encounter: Payer: Self-pay | Admitting: *Deleted

## 2019-12-05 DIAGNOSIS — R1310 Dysphagia, unspecified: Secondary | ICD-10-CM | POA: Insufficient documentation

## 2019-12-05 MED ORDER — PANTOPRAZOLE SODIUM 40 MG PO TBEC: 40.0000 mg | DELAYED_RELEASE_TABLET | Freq: Every day | ORAL | 3 refills | Status: DC

## 2019-12-05 NOTE — Telephone Encounter (Signed)
PA approved for procedures via [employer]. Auth# [id number] dates 02/01/2020-03/02/2020

## 2019-12-05 NOTE — Patient Instructions (Signed)
I have sent in Protonix to take once each morning, 30 minutes before breakfast on an empty stomach.  We are arranging an upper endoscopy with dilation in the near future.  We will see you in 4-6 months!  Your next colonoscopy is in November 2021.   It was a pleasure to see you today. I want to create trusting relationships with patients to provide genuine, compassionate, and quality care. I value your feedback. If you receive a survey regarding your visit,  I greatly appreciate you taking time to fill this out.   Annitta Needs, PhD, ANP-BC [facility] Gastroenterology

## 2019-12-12 ENCOUNTER — Encounter: Payer: Medicare PPO | Attending: Internal Medicine | Admitting: Nutrition

## 2019-12-12 ENCOUNTER — Other Ambulatory Visit: Payer: Self-pay

## 2019-12-12 ENCOUNTER — Telehealth: Payer: Self-pay | Admitting: Nutrition

## 2019-12-12 ENCOUNTER — Encounter: Payer: Self-pay | Admitting: Nutrition

## 2019-12-12 DIAGNOSIS — E1165 Type 2 diabetes mellitus with hyperglycemia: Secondary | ICD-10-CM | POA: Insufficient documentation

## 2019-12-12 DIAGNOSIS — E669 Obesity, unspecified: Secondary | ICD-10-CM | POA: Diagnosis present

## 2019-12-12 DIAGNOSIS — E782 Mixed hyperlipidemia: Secondary | ICD-10-CM | POA: Insufficient documentation

## 2019-12-12 DIAGNOSIS — IMO0002 Reserved for concepts with insufficient information to code with codable children: Secondary | ICD-10-CM

## 2019-12-12 DIAGNOSIS — E118 Type 2 diabetes mellitus with unspecified complications: Secondary | ICD-10-CM | POA: Insufficient documentation

## 2019-12-12 NOTE — Progress Notes (Signed)
  Medical Nutrition Therapy:  Appt start time: 0950 end time: 1015 Assessment:  Primary concerns today: Diabetes Type 2. Lives with his wife who has DM also.  A1C 6.3%. Saw Dr. Manuella Ghazi recently.  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 Having to be very careful with foods. Skips lunch at times due to helping move stuff out of family members house. Had to take antibiotic for a tooth problem. Metformin 500 mg BID.Struggles with swallowing and has to be careful. Been more active moving stuff out of family houses. Wellness visit April 21,2021. To get bloodwork then. [id number].     Vitals with BMI 08/09/2019  Height 5\' 6"   Weight 186 lbs 10 oz  BMI 123XX123  Systolic   Diastolic       Preferred Learning Style:     No preference indicated   Learning Readiness:   Ready  Change in progress   MEDICATIONS: none   DIETARY INTAKE:  He is trying to eat more consistent meals. More active now but may be missing lunch often.  Usual physical activity: yard work  Estimated energy needs: 1500  calories 170 g carbohydrates 112 g protein 42 g fat  Progress Towards Goal(s):  In progress.   Nutritional Diagnosis:  NB-1.1 Food and nutrition-related knowledge deficit As related to Diabetes Type 2, .  As evidenced by A1c 6.9%..    Intervention:  Nutrition and Diabetes education provided on My Plate, CHO counting, meal planning, portion sizes, timing of meals, avoiding snacks between meals unless having a low blood sugar, target ranges for A1C and blood sugars, signs/symptoms and treatment of hyper/hypoglycemia, monitoring blood sugars, taking medications as prescribed, benefits of exercising 30 minutes per day and prevention of complications of DM.  Marland KitchenGoals  Continue to stay active. Continue to follow the Plate Method Don't skip meals Drink water Keep A1C 6.3% or less. Walk when able for exercise. Lose 5 lbs in the next 1-2  Months.  Teaching Method  Utilized: Visual Auditory Hands on  Handouts given during visit include:  The Plate Method   Meal Plan  Diabetes Instructions     Barriers to learning/adherence to lifestyle change  Demonstrated degree of understanding via:  Teach Back   Monitoring/Evaluation:  Dietary intake, exercise,  and body weight in 3 month(s).He may benefit from Victoza or Ozempic for needed weight loss if not too costly.

## 2019-12-12 NOTE — Telephone Encounter (Signed)
PT was on his way to appt. Just running late.

## 2019-12-12 NOTE — Patient Instructions (Addendum)
Goals  Continue to stay active. Continue to follow the Plate Method Don't skip meals Drink water Keep A1C 6.3% or less. Walk when able for exercise. Lose 5 lbs in the next 1-2  Months.

## 2020-01-16 DIAGNOSIS — Z299 Encounter for prophylactic measures, unspecified: Secondary | ICD-10-CM | POA: Diagnosis not present

## 2020-01-16 DIAGNOSIS — Z125 Encounter for screening for malignant neoplasm of prostate: Secondary | ICD-10-CM | POA: Diagnosis not present

## 2020-01-16 DIAGNOSIS — Z7189 Other specified counseling: Secondary | ICD-10-CM | POA: Diagnosis not present

## 2020-01-16 DIAGNOSIS — Z79899 Other long term (current) drug therapy: Secondary | ICD-10-CM | POA: Diagnosis not present

## 2020-01-16 DIAGNOSIS — E1165 Type 2 diabetes mellitus with hyperglycemia: Secondary | ICD-10-CM | POA: Diagnosis not present

## 2020-01-16 DIAGNOSIS — Z1211 Encounter for screening for malignant neoplasm of colon: Secondary | ICD-10-CM | POA: Diagnosis not present

## 2020-01-16 DIAGNOSIS — Z6834 Body mass index (BMI) 34.0-34.9, adult: Secondary | ICD-10-CM | POA: Diagnosis not present

## 2020-01-16 DIAGNOSIS — Z0000 Encounter for general adult medical examination without abnormal findings: Secondary | ICD-10-CM | POA: Diagnosis not present

## 2020-01-16 DIAGNOSIS — Z1339 Encounter for screening examination for other mental health and behavioral disorders: Secondary | ICD-10-CM | POA: Diagnosis not present

## 2020-01-16 DIAGNOSIS — E7800 Pure hypercholesterolemia, unspecified: Secondary | ICD-10-CM | POA: Diagnosis not present

## 2020-01-16 DIAGNOSIS — Z1331 Encounter for screening for depression: Secondary | ICD-10-CM | POA: Diagnosis not present

## 2020-01-16 DIAGNOSIS — R5383 Other fatigue: Secondary | ICD-10-CM | POA: Diagnosis not present

## 2020-01-16 DIAGNOSIS — Z6828 Body mass index (BMI) 28.0-28.9, adult: Secondary | ICD-10-CM | POA: Diagnosis not present

## 2020-01-29 ENCOUNTER — Other Ambulatory Visit: Payer: Self-pay

## 2020-01-29 ENCOUNTER — Encounter (HOSPITAL_COMMUNITY)
Admission: RE | Admit: 2020-01-29 | Discharge: 2020-01-29 | Disposition: A | Discharge status: Home / Self Care | Payer: Medicare PPO | Source: Ambulatory Visit | Attending: Gastroenterology | Admitting: Gastroenterology

## 2020-01-29 DIAGNOSIS — Z01812 Encounter for preprocedural laboratory examination: Secondary | ICD-10-CM | POA: Insufficient documentation

## 2020-01-29 DIAGNOSIS — Z20822 Contact with and (suspected) exposure to covid-19: Secondary | ICD-10-CM | POA: Diagnosis not present

## 2020-01-30 LAB — SARS CORONAVIRUS 2 (TAT 6-24 HRS): SARS Coronavirus 2: NEGATIVE

## 2020-02-01 ENCOUNTER — Encounter (HOSPITAL_COMMUNITY)
Admission: RE | Disposition: A | Discharge status: Home / Self Care | Payer: Self-pay | Source: Home / Self Care | Attending: Gastroenterology

## 2020-02-01 ENCOUNTER — Encounter (HOSPITAL_COMMUNITY): Payer: Self-pay | Admitting: Gastroenterology

## 2020-02-01 ENCOUNTER — Ambulatory Visit (HOSPITAL_COMMUNITY)
Admission: RE | Admit: 2020-02-01 | Discharge: 2020-02-01 | Disposition: A | Discharge status: Home / Self Care | Payer: Medicare PPO | Attending: Gastroenterology | Admitting: Gastroenterology

## 2020-02-01 ENCOUNTER — Other Ambulatory Visit: Payer: Self-pay

## 2020-02-01 DIAGNOSIS — Z7982 Long term (current) use of aspirin: Secondary | ICD-10-CM | POA: Diagnosis not present

## 2020-02-01 DIAGNOSIS — Z7951 Long term (current) use of inhaled steroids: Secondary | ICD-10-CM | POA: Insufficient documentation

## 2020-02-01 DIAGNOSIS — E119 Type 2 diabetes mellitus without complications: Secondary | ICD-10-CM | POA: Insufficient documentation

## 2020-02-01 DIAGNOSIS — K219 Gastro-esophageal reflux disease without esophagitis: Secondary | ICD-10-CM | POA: Diagnosis not present

## 2020-02-01 DIAGNOSIS — Z79899 Other long term (current) drug therapy: Secondary | ICD-10-CM | POA: Insufficient documentation

## 2020-02-01 DIAGNOSIS — K2970 Gastritis, unspecified, without bleeding: Secondary | ICD-10-CM

## 2020-02-01 DIAGNOSIS — E785 Hyperlipidemia, unspecified: Secondary | ICD-10-CM | POA: Insufficient documentation

## 2020-02-01 DIAGNOSIS — R1310 Dysphagia, unspecified: Secondary | ICD-10-CM | POA: Diagnosis not present

## 2020-02-01 DIAGNOSIS — Z7984 Long term (current) use of oral hypoglycemic drugs: Secondary | ICD-10-CM | POA: Diagnosis not present

## 2020-02-01 DIAGNOSIS — K5710 Diverticulosis of small intestine without perforation or abscess without bleeding: Secondary | ICD-10-CM | POA: Diagnosis not present

## 2020-02-01 DIAGNOSIS — J45909 Unspecified asthma, uncomplicated: Secondary | ICD-10-CM | POA: Diagnosis not present

## 2020-02-01 DIAGNOSIS — K222 Esophageal obstruction: Secondary | ICD-10-CM | POA: Diagnosis not present

## 2020-02-01 HISTORY — PX: SAVORY DILATION: SHX5439

## 2020-02-01 HISTORY — PX: BIOPSY: SHX5522

## 2020-02-01 HISTORY — PX: ESOPHAGOGASTRODUODENOSCOPY: SHX5428

## 2020-02-01 LAB — GLUCOSE, CAPILLARY: Glucose-Capillary: 192 mg/dL — ABNORMAL HIGH (ref 70–99)

## 2020-02-01 SURGERY — EGD (ESOPHAGOGASTRODUODENOSCOPY)
Anesthesia: Moderate Sedation

## 2020-02-01 MED ORDER — LIDOCAINE VISCOUS HCL 2 % MT SOLN
OROMUCOSAL | Status: DC | PRN
  Administered 2020-02-01: 1 via OROMUCOSAL

## 2020-02-01 MED ORDER — MEPERIDINE HCL 100 MG/ML IJ SOLN
INTRAMUSCULAR | Status: DC | PRN
  Administered 2020-02-01 (×2): 25 mg via INTRAVENOUS

## 2020-02-01 MED ORDER — MINERAL OIL PO OIL
TOPICAL_OIL | ORAL | Status: AC
  Filled 2020-02-01: qty 30

## 2020-02-01 MED ORDER — MIDAZOLAM HCL 5 MG/5ML IJ SOLN
INTRAMUSCULAR | Status: AC
  Filled 2020-02-01: qty 10

## 2020-02-01 MED ORDER — MEPERIDINE HCL 100 MG/ML IJ SOLN
INTRAMUSCULAR | Status: AC
  Filled 2020-02-01: qty 2

## 2020-02-01 MED ORDER — STERILE WATER FOR IRRIGATION IR SOLN
Status: DC | PRN
  Administered 2020-02-01: 1.5 mL

## 2020-02-01 MED ORDER — LIDOCAINE VISCOUS HCL 2 % MT SOLN
OROMUCOSAL | Status: AC
  Filled 2020-02-01: qty 15

## 2020-02-01 MED ORDER — SODIUM CHLORIDE 0.9 % IV SOLN
INTRAVENOUS | Status: DC
  Administered 2020-02-01: 1000 mL via INTRAVENOUS

## 2020-02-01 MED ORDER — MIDAZOLAM HCL 5 MG/5ML IJ SOLN
INTRAMUSCULAR | Status: DC | PRN
  Administered 2020-02-01 (×2): 2 mg via INTRAVENOUS

## 2020-02-01 NOTE — H&P (Signed)
Primary Care Physician:  Glenda Chroman, MD Primary Gastroenterologist:  Dr. Oneida Alar  Pre-Procedure History & Physical: HPI:  Edwin Burgess is a 73 y.o. male here for [place].  Past Medical History:  Diagnosis Date  . Asthma   . DM (diabetes mellitus) ([place])   . GERD (gastroesophageal reflux disease)   . Hyperlipidemia   . Personal history of colonic polyps     Past Surgical History:  Procedure Laterality Date  . CATARACT EXTRACTION, BILATERAL    . COLONOSCOPY  Aug 12/2005   dr.fleishman/mild diverticulosis. recommended to have next TCS 2012.  Marland Kitchen COLONOSCOPY  06/15/2012   [id number] internal hemorrhoids/ Mild diverticulosis/Eight sessile polyps measuring 2-6 mm (tubular adenomas). Surveillance in 5 years due to multiple adenomas  . COLONOSCOPY N/A 08/26/2017   One 2 mm polyps in ascending colon, seven 3-6 mm polyps in sigmoid, transverse, and cecum, diverticulosis in rectosigmoid, descending, and cecum, external and internal hemorrhoids. (seven simple adenomas and one hyperplastic polyp). 3 year surveillance   . ESOPHAGOGASTRODUODENOSCOPY  07/06/10   tight schatzki ring s/p dilation/small hiatal hernia  . POLYPECTOMY  08/26/2017   Procedure: POLYPECTOMY;  Surgeon: Danie Binder, MD;  [facility];  Service: Endoscopy;;  cecal; transverse colon; ascending colon     Prior to Admission medications   Medication Sig Start Date End Date Taking? Authorizing Provider  aspirin 81 MG tablet Take 81 mg by mouth daily.   Yes [provider]  Cholecalciferol (VITAMIN D3) 125 MCG (5000 UT) TABS Take 5,000 Units by mouth daily.    Yes [provider]  Cyanocobalamin (VITAMIN B-12) 5000 MCG TBDP Take 5,000 mcg by mouth daily.    Yes [provider]  Fluticasone-Salmeterol (ADVAIR) 250-50 MCG/DOSE AEPB Inhale 1 puff into the lungs in the morning and at bedtime.    Yes [provider]  lisinopril (PRINIVIL,ZESTRIL) 2.5 MG tablet Take 2.5 mg by mouth  daily.   Yes [provider]  loratadine (CLARITIN) 10 MG tablet Take 10 mg by mouth daily.   Yes [provider]  metFORMIN (GLUCOPHAGE) 500 MG tablet Take 1,000 mg by mouth 2 (two) times daily with a meal.  10/13/11  Yes [provider]  montelukast (SINGULAIR) 10 MG tablet Take 10 mg by mouth at bedtime.   Yes [provider]  Multiple Vitamin (MULTIVITAMIN) capsule Take 1 capsule by mouth daily.   Yes [provider]  olopatadine (PATANOL) 0.1 % ophthalmic solution Place 1 drop into both eyes daily as needed for allergies.   Yes [provider]  pantoprazole (PROTONIX) 40 MG tablet Take 1 tablet (40 mg total) by mouth daily. Take 30 minutes before breakfast 12/05/19  Yes Annitta Needs, NP  rosuvastatin (CRESTOR) 10 MG tablet Take 10 mg by mouth every evening.    Yes [provider]  theophylline (THEODUR) 300 MG 12 hr tablet Take 300 mg by mouth 2 (two) times daily.  10/19/11  Yes [provider]  albuterol (PROVENTIL HFA;VENTOLIN HFA) 108 (90 BASE) MCG/ACT inhaler Inhale 2 puffs into the lungs every 6 (six) hours as needed. Asthma    [provider]    Allergies as of 12/05/2019 - Review Complete 12/05/2019  Allergen Reaction Noted  . Tetracycline Itching     Family History  Problem Relation Age of Onset  . Colon cancer Maternal Grandfather 91  . Breast cancer Maternal Aunt     Social History   Socioeconomic History  . Marital status: Married  Spouse name: Not on file  . Number of children: 2  . Years of education: Not on file  . Highest education level: Not on file  Occupational History  . Occupation: [employer]  . Occupation: retired Education officer, museum  Tobacco Use  . Smoking status: Never Smoker  . Smokeless tobacco: Never Used  Substance and Sexual Activity  . Alcohol use: No  . Drug use: No  . Sexual activity: Not on file  Other Topics Concern  . Not on file  Social History  Narrative  . Not on file   Social Determinants of Health   Financial Resource Strain:   . Difficulty of Paying Living Expenses:   Food Insecurity:   . Worried About Charity fundraiser in the Last Year:   . Arboriculturist in the Last Year:   Transportation Needs:   . Film/video editor (Medical):   Marland Kitchen Lack of Transportation (Non-Medical):   Physical Activity:   . Days of Exercise per Week:   . Minutes of Exercise per Session:   Stress:   . Feeling of Stress :   Social Connections:   . Frequency of Communication with Friends and Family:   . Frequency of Social Gatherings with Friends and Family:   . Attends Religious Services:   . Active Member of Clubs or Organizations:   . Attends Archivist Meetings:   Marland Kitchen Marital Status:   Intimate Partner Violence:   . Fear of Current or Ex-Partner:   . Emotionally Abused:   Marland Kitchen Physically Abused:   . Sexually Abused:     Review of Systems: See HPI, otherwise negative ROS   Physical Exam: BP (!) 147/66   Pulse 75   Temp 98.8 F (37.1 C) (Oral)   Resp 18   Ht 5\' 6"  (1.676 m)   Wt 83.9 kg   SpO2 99%   BMI 29.86 kg/m  General:   Alert,  pleasant and cooperative in NAD Head:  Normocephalic and atraumatic. Neck:  Supple; Lungs:  Clear throughout to auscultation.    Heart:  Regular rate and rhythm. Abdomen:  Soft, nontender and nondistended. Normal bowel sounds, without guarding, and without rebound.   Neurologic:  Alert and  oriented x4;  grossly normal neurologically.  Impression/Plan:     DYSPHAGIA  PLAN:  EGD/DIL TODAY.  DISCUSSED PROCEDURE, BENEFITS, & RISKS: < 1% chance of medication reaction, bleeding, perforation, or ASPIRATION.

## 2020-02-01 NOTE — Op Note (Signed)
[facility] Patient Name: Edwin Burgess Procedure Date: 02/01/2020 10:32 AM MRN: [id number] Date of Birth: 02-20-1946 Attending MD: Barney Drain MD, MD CSN: [id number] Age: 74 Admit Type: Outpatient Procedure:                Upper GI endoscopy WITH COLD FORCEPS                            BIOPSY/ESOPHAGEAL DILATION Indications:              Dysphagia Providers:                Barney Drain MD, MD, Charlsie Quest. Theda Sers RN, RN,                            Aram Candela Referring MD:             Glenda Chroman Medicines:                Meperidine 50 mg IV, Midazolam 4 mg IV Complications:            No immediate complications. Estimated Blood Loss:     Estimated blood loss was minimal. Procedure:                Pre-Anesthesia Assessment:                           - Prior to the procedure, a History and Physical                            was performed, and patient medications and                            allergies were reviewed. The patient's tolerance of                            previous anesthesia was also reviewed. The risks                            and benefits of the procedure and the sedation                            options and risks were discussed with the patient.                            All questions were answered, and informed consent                            was obtained. Prior Anticoagulants: The patient has                            taken no previous anticoagulant or antiplatelet                            agents except for aspirin. ASA Grade Assessment: II                            -  A patient with mild systemic disease. After                            reviewing the risks and benefits, the patient was                            deemed in satisfactory condition to undergo the                            procedure. After obtaining informed consent, the                            endoscope was passed under direct vision.                            Throughout the  procedure, the patient's blood                            pressure, pulse, and oxygen saturations were                            monitored continuously. The [id number]                            scope was introduced through the mouth, and                            advanced to the duodenal bulb. The upper GI                            endoscopy was accomplished without difficulty. The                            patient tolerated the procedure well. Scope In: 10:52:40 AM Scope Out: 10:59:56 AM Total Procedure Duration: 0 hours 7 minutes 16 seconds  Findings:      One benign-appearing, intrinsic mild stenosis was found. This stenosis       measured 1.4 cm (inner diameter). The stenosis was traversed. A       guidewire was placed and the scope was withdrawn. Dilation was performed       with a Savary dilator with severe resistance at 16 mm. Estimated blood       loss was minimal.      Localized mild inflammation characterized by congestion (edema) and       erythema was found on the greater curvature of the stomach and in the       gastric antrum. Biopsies(2:BODY,1:INCISURA,2:ANTRUM) were taken with a       cold forceps for histology.      A large non-bleeding diverticulum was found in the second portion of the       duodenum.      The duodenal bulb was normal. Impression:               - Benign-appearing esophageal STRICTURE. Dilated.                           -  MILD Gastritis DUE TO ASA. Biopsied.                           - Non-bleeding duodenal diverticulum. Moderate Sedation:      Moderate (conscious) sedation was administered by the endoscopy nurse       and supervised by the endoscopist. The following parameters were       monitored: oxygen saturation, heart rate, blood pressure, and response       to care. Total physician intraservice time was 14 minutes. Recommendation:           - Patient has a contact number available for                            emergencies. The  signs and symptoms of potential                            delayed complications were discussed with the                            patient. Return to normal activities tomorrow.                            Written discharge instructions were provided to the                            patient.                           - Resume previous diet.                           - Continue present medications.                           - Await pathology results.                           - Return to GI office in 6 months. Procedure Code(s):        --- Professional ---                           [phone], Esophagogastroduodenoscopy, flexible,                            transoral; with insertion of guide wire followed by                            passage of dilator(s) through esophagus over guide                            wire                           [id number], 59, Esophagogastroduodenoscopy, flexible,                            transoral; with  biopsy, single or multiple                           G0500, Moderate sedation services provided by the                            same physician or other qualified health care                            professional performing a gastrointestinal                            endoscopic service that sedation supports,                            requiring the presence of an independent trained                            observer to assist in the monitoring of the                            patient's level of consciousness and physiological                            status; initial 15 minutes of intra-service time;                            patient age 74 years or older (additional time may                            be reported with [phone], as appropriate) Diagnosis Code(s):        --- Professional ---                           K22.2, Esophageal obstruction                           K29.70, Gastritis, unspecified, without bleeding                           R13.10,  Dysphagia, unspecified                           K57.10, Diverticulosis of small intestine without                            perforation or abscess without bleeding CPT copyright 2019 American Medical Association. All rights reserved. The codes documented in this report are preliminary and upon coder review may  be revised to meet current compliance requirements. Barney Drain, MD Barney Drain MD, MD 02/01/2020 11:15:37 AM This report has been signed electronically. Number of Addenda: 0

## 2020-02-01 NOTE — Discharge Instructions (Signed)
I STRETCHED your esophagus DUE TO A STRICTURE. You have mild gastritis. I BIOPSIED YOUR STOMACH.  TO CONTROL HEARTBURN/reflux:   1. DRINK WATER TO KEEP YOUR URINE LIGHT YELLOW.    2. Avoid reflux triggers. SEE INFO BELOW.    3. STRICTLY FOLLOW A LOW FAT DIET. MEATS SHOULD BE BAKED, BROILED, OR BOILED. AVOID FRIED AND SPICY FOODS.    4. CONTINUE PROTONIX.    5. USE PEPCID OR TAGAMET FOR BREAKTHROUGH HEARTBURN/REFLUX.  FOLLOW UP IN 6 MOS.     UPPER ENDOSCOPY AFTER CARE Read the instructions outlined below and refer to this sheet in the next week. These discharge instructions provide you with general information on caring for yourself after you leave the hospital. While your treatment has been planned according to the most current medical practices available, unavoidable complications occasionally occur. If you have any problems or questions after discharge, call DR. Maryah Marinaro, [phone].  ACTIVITY  You may resume your regular activity, but move at a slower pace for the next 24 hours.   Take frequent rest periods for the next 24 hours.   Walking will help get rid of the air and reduce the bloated feeling in your belly (abdomen).   No driving for 24 hours (because of the medicine (anesthesia) used during the test).   You may shower.   Do not sign any important legal documents or operate any machinery for 24 hours (because of the anesthesia used during the test).    NUTRITION  Drink plenty of fluids.   You may resume your normal diet as instructed by your doctor.   Begin with a light meal and progress to your normal diet. Heavy or fried foods are harder to digest and may make you feel sick to your stomach (nauseated).   Avoid alcoholic beverages for 24 hours or as instructed.    MEDICATIONS  You may resume your normal medications.   WHAT YOU CAN EXPECT TODAY  Some feelings of bloating in the abdomen.   Passage of more gas than usual.    IF YOU HAD A BIOPSY TAKEN  DURING THE UPPER ENDOSCOPY:  Eat a soft diet IF YOU HAVE NAUSEA, BLOATING, ABDOMINAL PAIN, OR VOMITING.    FINDING OUT THE RESULTS OF YOUR TEST Not all test results are available during your visit. DR. Oneida Alar WILL CALL YOU WITHIN 7 DAYS OF YOUR PROCEDUE WITH YOUR RESULTS. Do not assume everything is normal if you have not heard from DR. Louretta Tantillo IN ONE WEEK, CALL HER OFFICE AT [phone].  SEEK IMMEDIATE MEDICAL ATTENTION AND CALL THE OFFICE: [phone] IF:  You have more than a spotting of blood in your stool.   Your belly is swollen (abdominal distention).   You are nauseated or vomiting.   You have a temperature over 101F.   You have abdominal pain or discomfort that is severe or gets worse throughout the day.   Lifestyle and home remedies TO CONTROL HEARTBURN/REFLUX You may eliminate or reduce the frequency of heartburn by making the following lifestyle changes:   Control your weight. Being overweight is a major risk factor for heartburn and GERD. Excess pounds put pressure on your abdomen, pushing up your stomach and causing acid to back up into your esophagus.    Eat smaller meals. 4 TO 6 MEALS A DAY. This reduces pressure on the lower esophageal sphincter, helping to prevent the valve from opening and acid from washing back into your esophagus.    Loosen your belt. Clothes that fit tightly  around your waist put pressure on your abdomen and the lower esophageal sphincter.    Eliminate heartburn triggers. Everyone has specific triggers. Common triggers such as fatty or fried foods, spicy food, tomato sauce, carbonated beverages, alcohol, chocolate, mint, garlic, onion, caffeine and nicotine may make heartburn worse.    Avoid stooping or bending. Tying your shoes is OK. Bending over for longer periods to weed your garden isn't, especially soon after eating.    Don't lie down after a meal. Wait at least three to four hours after eating before going to bed, and don't lie  down right after eating.   Alternative medicine  Several home remedies exist for treating GERD, but they provide only temporary relief. They include drinking baking soda (sodium bicarbonate) added to water or drinking other fluids such as baking soda mixed with cream of tartar and water.  Although these liquids create temporary relief by neutralizing, washing away or buffering acids, eventually they aggravate the situation by adding gas and fluid to your stomach, increasing pressure and causing more acid reflux. Further, adding more sodium to your diet may increase your blood pressure and add stress to your heart, and excessive bicarbonate ingestion can alter the acid-base balance in your body.   ESOPHAGEAL STRICTURE  Esophageal strictures can be caused by stomach acid backing up into the tube that carries food from the mouth down to the stomach (lower esophagus).  TREATMENT There are a number of medicines used to treat reflux/stricture, including: Antacids.  PEPCID OR TAGAMET Proton-pump inhibitors: PANTOPRAZOLE

## 2020-02-04 ENCOUNTER — Other Ambulatory Visit: Payer: Self-pay

## 2020-02-04 DIAGNOSIS — E1165 Type 2 diabetes mellitus with hyperglycemia: Secondary | ICD-10-CM | POA: Diagnosis not present

## 2020-02-04 DIAGNOSIS — I10 Essential (primary) hypertension: Secondary | ICD-10-CM | POA: Diagnosis not present

## 2020-02-04 DIAGNOSIS — Z299 Encounter for prophylactic measures, unspecified: Secondary | ICD-10-CM | POA: Diagnosis not present

## 2020-02-04 DIAGNOSIS — G4730 Sleep apnea, unspecified: Secondary | ICD-10-CM | POA: Diagnosis not present

## 2020-02-04 LAB — SURGICAL PATHOLOGY

## 2020-02-06 ENCOUNTER — Encounter: Payer: Self-pay | Admitting: Internal Medicine

## 2020-03-03 ENCOUNTER — Other Ambulatory Visit: Payer: Self-pay | Admitting: Gastroenterology

## 2020-04-09 ENCOUNTER — Encounter: Payer: Self-pay | Admitting: Nutrition

## 2020-04-09 ENCOUNTER — Other Ambulatory Visit: Payer: Self-pay

## 2020-04-09 ENCOUNTER — Encounter: Payer: Medicare PPO | Attending: "Endocrinology | Admitting: Nutrition

## 2020-04-09 VITALS — Ht 66.0 in | Wt 193.0 lb

## 2020-04-09 DIAGNOSIS — E782 Mixed hyperlipidemia: Secondary | ICD-10-CM | POA: Diagnosis not present

## 2020-04-09 DIAGNOSIS — R1310 Dysphagia, unspecified: Secondary | ICD-10-CM | POA: Insufficient documentation

## 2020-04-09 DIAGNOSIS — E1165 Type 2 diabetes mellitus with hyperglycemia: Secondary | ICD-10-CM | POA: Insufficient documentation

## 2020-04-09 DIAGNOSIS — E669 Obesity, unspecified: Secondary | ICD-10-CM

## 2020-04-09 DIAGNOSIS — E118 Type 2 diabetes mellitus with unspecified complications: Secondary | ICD-10-CM | POA: Diagnosis not present

## 2020-04-09 DIAGNOSIS — IMO0002 Reserved for concepts with insufficient information to code with codable children: Secondary | ICD-10-CM

## 2020-04-09 NOTE — Patient Instructions (Addendum)
Goals Get Am BS less than 150 mg/dl. Get to bed by 1 am nightly. Cut out fat back and sugared cereals. Increase more lower carb vegetables. Drink only water. Walk 15 minutes a day outside of regular activities. Lose 2 lbs per month.

## 2020-04-09 NOTE — Progress Notes (Signed)
  Medical Nutrition Therapy:  Appt start time: 0950 end time: 1015 Assessment:  Primary concerns today: Diabetes Type 2. Lives with his wife who has DM also.  A1C 6.7%. Saw Dr. Manuella Ghazi recently.  A1C has improved. Got his throat stretched due to stricture.  Eating three meals per day. 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 FBS 170-200's.. Takes 2  1000 mg metfomin BID; 2 g per day. Diet still has room for improvement by cutting out concentrated sweet cereal and processed fat back.   Vitals with BMI 08/09/2019  Height 5\' 6"   Weight 186 lbs 10 oz  BMI 67.54  Systolic   Diastolic       Preferred Learning Style:     No preference indicated   Learning Readiness:   Ready  Change in progress   MEDICATIONS: none   DIETARY INTAKE:  Breakfast: 3 slices of fatback, bowl Lucky charms. Lunch banana sandwich, water Dinner: Frozen dinner-mashed potatoes, carrots and [place].  Usual physical activity: yard work  Estimated energy needs: 1500  calories 170 g carbohydrates 112 g protein 42 g fat  Progress Towards Goal(s):  In progress.   Nutritional Diagnosis:  NB-1.1 Food and nutrition-related knowledge deficit As related to Diabetes Type 2, .  As evidenced by A1c 6.9%..    Intervention:  Nutrition and Diabetes education provided on My Plate, CHO counting, meal planning, portion sizes, timing of meals, avoiding snacks between meals unless having a low blood sugar, target ranges for A1C and blood sugars, signs/symptoms and treatment of hyper/hypoglycemia, monitoring blood sugars, taking medications as prescribed, benefits of exercising 30 minutes per day and prevention of complications of DM.  Goals  Get FBS less than 150 mg/dl in am. Get to bed by 1 am nightly. Cut out fat back and sugared cereals. Increase more lower carb vegetables. Drink only water. Walk 15 minutes a day outside of regular activities. Lose 2 lbs per month.  Teaching  Method Utilized: Visual Auditory Hands on  Handouts given during visit include:  The Plate Method   Meal Plan  Diabetes Instructions     Barriers to learning/adherence to lifestyle change  Demonstrated degree of understanding via:  Teach Back   Monitoring/Evaluation:  Dietary intake, exercise,  and body weight in 3 month(s).He may benefit from Victoza or Ozempic for needed weight loss if not too costly.

## 2020-04-16 ENCOUNTER — Encounter: Payer: Self-pay | Admitting: Nutrition

## 2020-05-08 ENCOUNTER — Ambulatory Visit: Payer: Medicare PPO | Admitting: Gastroenterology

## 2020-05-16 DIAGNOSIS — I10 Essential (primary) hypertension: Secondary | ICD-10-CM | POA: Diagnosis not present

## 2020-05-16 DIAGNOSIS — J45909 Unspecified asthma, uncomplicated: Secondary | ICD-10-CM | POA: Diagnosis not present

## 2020-05-16 DIAGNOSIS — Z299 Encounter for prophylactic measures, unspecified: Secondary | ICD-10-CM | POA: Diagnosis not present

## 2020-05-16 DIAGNOSIS — Z789 Other specified health status: Secondary | ICD-10-CM | POA: Diagnosis not present

## 2020-05-16 DIAGNOSIS — E1165 Type 2 diabetes mellitus with hyperglycemia: Secondary | ICD-10-CM | POA: Diagnosis not present

## 2020-05-19 DIAGNOSIS — Z299 Encounter for prophylactic measures, unspecified: Secondary | ICD-10-CM | POA: Diagnosis not present

## 2020-05-19 DIAGNOSIS — R609 Edema, unspecified: Secondary | ICD-10-CM | POA: Diagnosis not present

## 2020-05-19 DIAGNOSIS — I10 Essential (primary) hypertension: Secondary | ICD-10-CM | POA: Diagnosis not present

## 2020-06-27 DIAGNOSIS — Z299 Encounter for prophylactic measures, unspecified: Secondary | ICD-10-CM | POA: Diagnosis not present

## 2020-06-27 DIAGNOSIS — I10 Essential (primary) hypertension: Secondary | ICD-10-CM | POA: Diagnosis not present

## 2020-06-27 DIAGNOSIS — E1165 Type 2 diabetes mellitus with hyperglycemia: Secondary | ICD-10-CM | POA: Diagnosis not present

## 2020-06-27 DIAGNOSIS — G4730 Sleep apnea, unspecified: Secondary | ICD-10-CM | POA: Diagnosis not present

## 2020-06-27 DIAGNOSIS — Z6834 Body mass index (BMI) 34.0-34.9, adult: Secondary | ICD-10-CM | POA: Diagnosis not present

## 2020-06-28 ENCOUNTER — Other Ambulatory Visit: Payer: Self-pay | Admitting: Gastroenterology

## 2020-07-28 ENCOUNTER — Other Ambulatory Visit: Payer: Self-pay | Admitting: Nurse Practitioner

## 2020-08-08 ENCOUNTER — Other Ambulatory Visit: Payer: Self-pay

## 2020-08-08 ENCOUNTER — Encounter: Payer: Self-pay | Admitting: Gastroenterology

## 2020-08-08 ENCOUNTER — Ambulatory Visit: Payer: Medicare PPO | Admitting: Gastroenterology

## 2020-08-08 VITALS — BP 148/80 | HR 94 | Temp 96.9°F | Ht 66.0 in | Wt 191.8 lb

## 2020-08-08 DIAGNOSIS — K219 Gastro-esophageal reflux disease without esophagitis: Secondary | ICD-10-CM | POA: Diagnosis not present

## 2020-08-08 DIAGNOSIS — Z86010 Personal history of colonic polyps: Secondary | ICD-10-CM | POA: Diagnosis not present

## 2020-08-08 MED ORDER — PANTOPRAZOLE SODIUM 40 MG PO TBEC: 40.0000 mg | DELAYED_RELEASE_TABLET | Freq: Every day | ORAL | 3 refills | Status: DC

## 2020-08-08 NOTE — Patient Instructions (Addendum)
I have refilled Protonix for you with a 90 day supply and 3 refills.  We are arranging a colonoscopy with Dr. Abbey Chatters in the near future. Please do not take metformin the day of the procedure.   We will see you in 6-8 months.   I enjoyed seeing you again today! As you know, I value our relationship and want to provide genuine, compassionate, and quality care. I welcome your feedback. If you receive a survey regarding your visit,  I greatly appreciate you taking time to fill this out. See you next time!  Annitta Needs, PhD, ANP-BC [facility]

## 2020-08-08 NOTE — Patient Instructions (Signed)
PA for TCS submitted via [employer] website. Case approved. [employer]# [id number], valid 09/30/20-10/30/20.

## 2020-08-08 NOTE — Progress Notes (Signed)
Referring Provider: Glenda Chroman, MD Primary Care Physician:  Monico Blitz, MD Primary GI: Dr. Abbey Chatters  Chief Complaint  Patient presents with  . Consult    TCS due  . Gastroesophageal Reflux    wants rx sent in for 90 day supply    HPI:   Edwin Burgess is a 74 y.o. male presenting today with a history of GERD, dysphagia, undergoing EGD in May 2021 with dilation of benign-appearing stenosis, multiple colon polyps with 3 year surveillance due now.   Wife present with him today due to mild hard of hearing. Dysphagia resolved. Needs refill on Protonix. Taking once daily. No abdominal pain, N/V. No unexplained weight loss or lack of appetite. No overt GI bleeding. No changes in bowel habits. Denies constipation or diarrhea.     Past Medical History:  Diagnosis Date  . Asthma   . DM (diabetes mellitus) ([place])   . GERD (gastroesophageal reflux disease)   . Hyperlipidemia   . Personal history of colonic polyps     Past Surgical History:  Procedure Laterality Date  . BIOPSY  02/01/2020   Procedure: BIOPSY;  Surgeon: Danie Binder, MD;  Location: [facility];  Service: Endoscopy;;  . CATARACT EXTRACTION, BILATERAL    . COLONOSCOPY  Aug 12/2005   dr.fleishman/mild diverticulosis. recommended to have next TCS 2012.  Marland Kitchen COLONOSCOPY  06/15/2012   [id number] internal hemorrhoids/ Mild diverticulosis/Eight sessile polyps measuring 2-6 mm (tubular adenomas). Surveillance in 5 years due to multiple adenomas  . COLONOSCOPY N/A 08/26/2017   One 2 mm polyps in ascending colon, seven 3-6 mm polyps in sigmoid, transverse, and cecum, diverticulosis in rectosigmoid, descending, and cecum, external and internal hemorrhoids. (seven simple adenomas and one hyperplastic polyp). 3 year surveillance   . ESOPHAGOGASTRODUODENOSCOPY  07/06/10   tight schatzki ring s/p dilation/small hiatal hernia  . ESOPHAGOGASTRODUODENOSCOPY N/A 02/01/2020    one benign-appearing intrinsic mild stenosis s/p  dilation, mild gastritis, large non-bleeding duodenal diverticulum. Negative H.pylori.   Marland Kitchen POLYPECTOMY  08/26/2017   Procedure: POLYPECTOMY;  Surgeon: Danie Binder, MD;  Location: [facility];  Service: Endoscopy;;  cecal; transverse colon; ascending colon   . SAVORY DILATION N/A 02/01/2020   Procedure: SAVORY DILATION;  Surgeon: Danie Binder, MD;  Location: [facility];  Service: Endoscopy;  Laterality: N/A;    Current Outpatient Medications  Medication Sig Dispense Refill  . albuterol (PROVENTIL HFA;VENTOLIN HFA) 108 (90 BASE) MCG/ACT inhaler Inhale 2 puffs into the lungs every 6 (six) hours as needed. Asthma    . aspirin 81 MG tablet Take 81 mg by mouth daily.    . Cholecalciferol (VITAMIN D3) 125 MCG (5000 UT) TABS Take 5,000 Units by mouth daily.     . Cyanocobalamin (VITAMIN B-12) 5000 MCG TBDP Take 5,000 mcg by mouth daily.     . Fluticasone-Salmeterol (ADVAIR) 250-50 MCG/DOSE AEPB Inhale 1 puff into the lungs in the morning and at bedtime.     Marland Kitchen lisinopril (PRINIVIL,ZESTRIL) 2.5 MG tablet Take 2.5 mg by mouth daily.    Marland Kitchen loratadine (CLARITIN) 10 MG tablet Take 10 mg by mouth daily.    . metFORMIN (GLUCOPHAGE) 500 MG tablet Take 1,000 mg by mouth 2 (two) times daily with a meal.     . montelukast (SINGULAIR) 10 MG tablet Take 10 mg by mouth at bedtime.    . Multiple Vitamin (MULTIVITAMIN) capsule Take 1 capsule by mouth daily.    Marland Kitchen olopatadine (PATANOL) 0.1 %  ophthalmic solution Place 1 drop into both eyes daily as needed for allergies.    . pantoprazole (PROTONIX) 40 MG tablet Take 1 tablet (40 mg total) by mouth daily before breakfast. 90 tablet 3  . rosuvastatin (CRESTOR) 10 MG tablet Take 10 mg by mouth every evening.     . theophylline (THEODUR) 300 MG 12 hr tablet Take 300 mg by mouth 2 (two) times daily.      No current facility-administered medications for this visit.    Allergies as of 08/08/2020 - Review Complete 08/08/2020  Allergen Reaction Noted  .  Tetracycline Itching     Family History  Problem Relation Age of Onset  . Colon cancer Maternal Grandfather 91  . Breast cancer Maternal Aunt     Social History   Socioeconomic History  . Marital status: Married    Spouse name: Not on file  . Number of children: 2  . Years of education: Not on file  . Highest education level: Not on file  Occupational History  . Occupation: [employer]  . Occupation: retired Education officer, museum  Tobacco Use  . Smoking status: Never Smoker  . Smokeless tobacco: Never Used  Vaping Use  . Vaping Use: Never used  Substance and Sexual Activity  . Alcohol use: No  . Drug use: No  . Sexual activity: Not on file  Other Topics Concern  . Not on file  Social History Narrative  . Not on file   Social Determinants of Health   Financial Resource Strain:   . Difficulty of Paying Living Expenses: Not on file  Food Insecurity:   . Worried About Charity fundraiser in the Last Year: Not on file  . Ran Out of Food in the Last Year: Not on file  Transportation Needs:   . Lack of Transportation (Medical): Not on file  . Lack of Transportation (Non-Medical): Not on file  Physical Activity:   . Days of Exercise per Week: Not on file  . Minutes of Exercise per Session: Not on file  Stress:   . Feeling of Stress : Not on file  Social Connections:   . Frequency of Communication with Friends and Family: Not on file  . Frequency of Social Gatherings with Friends and Family: Not on file  . Attends Religious Services: Not on file  . Active Member of Clubs or Organizations: Not on file  . Attends Archivist Meetings: Not on file  . Marital Status: Not on file    Review of Systems: Gen: Denies fever, chills, anorexia. Denies fatigue, weakness, weight loss.  CV: Denies chest pain, palpitations, syncope, peripheral edema, and claudication. Resp: Denies dyspnea at rest, cough, wheezing, coughing up blood, and pleurisy. GI: see HPI Derm:  Denies rash, itching, dry skin Psych: Denies depression, anxiety, memory loss, confusion. No homicidal or suicidal ideation.  Heme: Denies bruising, bleeding, and enlarged lymph nodes.  Physical Exam: BP (!) 148/80   Pulse 94   Temp (!) 96.9 F (36.1 C)   Ht 5\' 6"  (1.676 m)   Wt 191 lb 12.8 oz (87 kg)   BMI 30.96 kg/m  General:   Alert and oriented. No distress noted. Pleasant and cooperative.  Head:  Normocephalic and atraumatic. Eyes:  Conjuctiva clear without scleral icterus. Mouth:  Mask in place Cardiac: S1 S2 present without murmurs Lungs: clear bilaterally Abdomen:  +BS, soft, non-tender and non-distended. No rebound or guarding. No HSM or masses noted. Msk:  Kyphosis  Extremities:  Without  edema. Neurologic:  Alert and  oriented x4 Psych:  Alert and cooperative. Normal mood and affect.  ASSESSMENT: Edwin Burgess is a 74 y.o. male presenting today with history of GERD, dysphagia s/p dilation earlier this year with resolution of symptoms, history of multiple colon polyps and need for 3-year-surveillance now.  GERD well controlled on Protonix once daily. Refills provided.   He has no concerning lower GI signs/symptoms. No overt rectal bleeding. Will arrange routine surveillance upcoming.    PLAN:  Proceed with colonoscopy by Dr. Abbey Chatters in near future: the risks, benefits, and alternatives have been discussed with the patient in detail. The patient states understanding and desires to proceed.  No metformin day of procedure  Protonix once daily, refills provided  Return in 6-8 months  Annitta Needs, PhD, [facility]

## 2020-08-18 DIAGNOSIS — I10 Essential (primary) hypertension: Secondary | ICD-10-CM | POA: Diagnosis not present

## 2020-08-18 DIAGNOSIS — E1165 Type 2 diabetes mellitus with hyperglycemia: Secondary | ICD-10-CM | POA: Diagnosis not present

## 2020-08-18 DIAGNOSIS — J45909 Unspecified asthma, uncomplicated: Secondary | ICD-10-CM | POA: Diagnosis not present

## 2020-08-18 DIAGNOSIS — H5211 Myopia, right eye: Secondary | ICD-10-CM | POA: Diagnosis not present

## 2020-08-18 DIAGNOSIS — Z961 Presence of intraocular lens: Secondary | ICD-10-CM | POA: Diagnosis not present

## 2020-08-18 DIAGNOSIS — H26491 Other secondary cataract, right eye: Secondary | ICD-10-CM | POA: Diagnosis not present

## 2020-08-18 DIAGNOSIS — H11823 Conjunctivochalasis, bilateral: Secondary | ICD-10-CM | POA: Diagnosis not present

## 2020-08-18 DIAGNOSIS — E119 Type 2 diabetes mellitus without complications: Secondary | ICD-10-CM | POA: Diagnosis not present

## 2020-08-18 DIAGNOSIS — H524 Presbyopia: Secondary | ICD-10-CM | POA: Diagnosis not present

## 2020-08-18 DIAGNOSIS — H11153 Pinguecula, bilateral: Secondary | ICD-10-CM | POA: Diagnosis not present

## 2020-08-18 DIAGNOSIS — Z6834 Body mass index (BMI) 34.0-34.9, adult: Secondary | ICD-10-CM | POA: Diagnosis not present

## 2020-08-18 DIAGNOSIS — T8522XS Displacement of intraocular lens, sequela: Secondary | ICD-10-CM | POA: Diagnosis not present

## 2020-08-18 DIAGNOSIS — Z9842 Cataract extraction status, left eye: Secondary | ICD-10-CM | POA: Diagnosis not present

## 2020-08-18 DIAGNOSIS — Z299 Encounter for prophylactic measures, unspecified: Secondary | ICD-10-CM | POA: Diagnosis not present

## 2020-09-10 DIAGNOSIS — H5211 Myopia, right eye: Secondary | ICD-10-CM | POA: Diagnosis not present

## 2020-09-10 DIAGNOSIS — Z7984 Long term (current) use of oral hypoglycemic drugs: Secondary | ICD-10-CM | POA: Diagnosis not present

## 2020-09-10 DIAGNOSIS — H11153 Pinguecula, bilateral: Secondary | ICD-10-CM | POA: Diagnosis not present

## 2020-09-10 DIAGNOSIS — H524 Presbyopia: Secondary | ICD-10-CM | POA: Diagnosis not present

## 2020-09-10 DIAGNOSIS — H11823 Conjunctivochalasis, bilateral: Secondary | ICD-10-CM | POA: Diagnosis not present

## 2020-09-10 DIAGNOSIS — H3540 Unspecified peripheral retinal degeneration: Secondary | ICD-10-CM | POA: Diagnosis not present

## 2020-09-10 DIAGNOSIS — H1132 Conjunctival hemorrhage, left eye: Secondary | ICD-10-CM | POA: Diagnosis not present

## 2020-09-10 DIAGNOSIS — H52222 Regular astigmatism, left eye: Secondary | ICD-10-CM | POA: Diagnosis not present

## 2020-09-10 DIAGNOSIS — H5202 Hypermetropia, left eye: Secondary | ICD-10-CM | POA: Diagnosis not present

## 2020-09-25 NOTE — Patient Instructions (Addendum)
Your procedure is scheduled on: 09/30/2020  Report to [facility] at   7:30  AM.  Call this number if you have problems the morning of surgery: [phone]   Remember:              Follow Directions on the letter you received from Your Physician's office regarding the Bowel Prep              No Smoking the day of Procedure :   Take these medicines the morning of surgery with A SIP OF WATER: Theophylline, Pantoprazole, Claritin and Singulair              Use inhaler if needed   Do not wear jewelry, make-up or nail polish.    Do not bring valuables to the hospital.  Contacts, dentures or bridgework may not be worn into surgery.  .   Patients discharged the day of surgery will not be allowed to drive home.     Colonoscopy, Adult, Care After This sheet gives you information about how to care for yourself after your procedure. Your health care provider may also give you more specific instructions. If you have problems or questions, contact your health care provider. What can I expect after the procedure? After the procedure, it is common to have:  A small amount of blood in your stool for 24 hours after the procedure.  Some gas.  Mild abdominal cramping or bloating.  Follow these instructions at home: General instructions   For the first 24 hours after the procedure: ? Do not drive or use machinery. ? Do not sign important documents. ? Do not drink alcohol. ? Do your regular daily activities at a slower pace than normal. ? Eat soft, easy-to-digest foods. ? Rest often.  Take over-the-counter or prescription medicines only as told by your health care provider.  It is up to you to get the results of your procedure. Ask your health care provider, or the department performing the procedure, when your results will be ready. Relieving cramping and bloating  Try walking around when you have cramps or feel bloated.  Apply heat to your abdomen as told by your health care  provider. Use a heat source that your health care provider recommends, such as a moist heat pack or a heating pad. ? Place a towel between your skin and the heat source. ? Leave the heat on for 20-30 minutes. ? Remove the heat if your skin turns bright red. This is especially important if you are unable to feel pain, heat, or cold. You may have a greater risk of getting burned. Eating and drinking  Drink enough fluid to keep your urine clear or pale yellow.  Resume your normal diet as instructed by your health care provider. Avoid heavy or fried foods that are hard to digest.  Avoid drinking alcohol for as long as instructed by your health care provider. Contact a health care provider if:  You have blood in your stool 2-3 days after the procedure. Get help right away if:  You have more than a small spotting of blood in your stool.  You pass large blood clots in your stool.  Your abdomen is swollen.  You have nausea or vomiting.  You have a fever.  You have increasing abdominal pain that is not relieved with medicine. This information is not intended to replace advice given to you by your health care provider. Make sure you discuss any questions you have with your  health care provider. Document Released: 04/27/2004 Document Revised: 06/07/2016 Document Reviewed: 11/25/2015 Elsevier Interactive Patient Education  [employer].

## 2020-09-29 ENCOUNTER — Other Ambulatory Visit: Payer: Self-pay

## 2020-09-29 ENCOUNTER — Encounter (HOSPITAL_COMMUNITY)
Admission: RE | Admit: 2020-09-29 | Discharge: 2020-09-29 | Disposition: A | Discharge status: Home / Self Care | Payer: Medicare PPO | Source: Ambulatory Visit | Attending: Internal Medicine | Admitting: Internal Medicine

## 2020-09-29 ENCOUNTER — Other Ambulatory Visit (HOSPITAL_COMMUNITY)
Admission: RE | Admit: 2020-09-29 | Discharge: 2020-09-29 | Disposition: A | Discharge status: Home / Self Care | Payer: Medicare PPO | Source: Ambulatory Visit | Attending: Internal Medicine | Admitting: Internal Medicine

## 2020-09-29 ENCOUNTER — Encounter (HOSPITAL_COMMUNITY): Payer: Self-pay

## 2020-09-29 DIAGNOSIS — I10 Essential (primary) hypertension: Secondary | ICD-10-CM | POA: Insufficient documentation

## 2020-09-29 DIAGNOSIS — Z01818 Encounter for other preprocedural examination: Secondary | ICD-10-CM | POA: Insufficient documentation

## 2020-09-29 DIAGNOSIS — Z20822 Contact with and (suspected) exposure to covid-19: Secondary | ICD-10-CM | POA: Insufficient documentation

## 2020-09-29 HISTORY — DX: Sleep apnea, unspecified: G47.30

## 2020-09-29 LAB — SARS CORONAVIRUS 2 (TAT 6-24 HRS): SARS Coronavirus 2: NEGATIVE

## 2020-09-30 ENCOUNTER — Ambulatory Visit (HOSPITAL_COMMUNITY)
Admission: RE | Admit: 2020-09-30 | Discharge: 2020-09-30 | Disposition: A | Discharge status: Home / Self Care | Payer: Medicare PPO | Attending: Internal Medicine | Admitting: Internal Medicine

## 2020-09-30 ENCOUNTER — Encounter (HOSPITAL_COMMUNITY)
Admission: RE | Disposition: A | Discharge status: Home / Self Care | Payer: Self-pay | Source: Home / Self Care | Attending: Internal Medicine

## 2020-09-30 ENCOUNTER — Ambulatory Visit (HOSPITAL_COMMUNITY): Payer: Medicare PPO | Admitting: Anesthesiology

## 2020-09-30 ENCOUNTER — Encounter (HOSPITAL_COMMUNITY): Payer: Self-pay

## 2020-09-30 DIAGNOSIS — D125 Benign neoplasm of sigmoid colon: Secondary | ICD-10-CM | POA: Insufficient documentation

## 2020-09-30 DIAGNOSIS — Z800 Family history of malignant neoplasm of digestive organs: Secondary | ICD-10-CM | POA: Diagnosis not present

## 2020-09-30 DIAGNOSIS — Z7982 Long term (current) use of aspirin: Secondary | ICD-10-CM | POA: Diagnosis not present

## 2020-09-30 DIAGNOSIS — K5730 Diverticulosis of large intestine without perforation or abscess without bleeding: Secondary | ICD-10-CM | POA: Insufficient documentation

## 2020-09-30 DIAGNOSIS — K635 Polyp of colon: Secondary | ICD-10-CM | POA: Diagnosis not present

## 2020-09-30 DIAGNOSIS — D122 Benign neoplasm of ascending colon: Secondary | ICD-10-CM | POA: Diagnosis not present

## 2020-09-30 DIAGNOSIS — Z79899 Other long term (current) drug therapy: Secondary | ICD-10-CM | POA: Insufficient documentation

## 2020-09-30 DIAGNOSIS — Z7951 Long term (current) use of inhaled steroids: Secondary | ICD-10-CM | POA: Diagnosis not present

## 2020-09-30 DIAGNOSIS — Z86010 Personal history of colonic polyps: Secondary | ICD-10-CM | POA: Insufficient documentation

## 2020-09-30 DIAGNOSIS — K648 Other hemorrhoids: Secondary | ICD-10-CM | POA: Insufficient documentation

## 2020-09-30 DIAGNOSIS — Z7984 Long term (current) use of oral hypoglycemic drugs: Secondary | ICD-10-CM | POA: Diagnosis not present

## 2020-09-30 DIAGNOSIS — Z09 Encounter for follow-up examination after completed treatment for conditions other than malignant neoplasm: Secondary | ICD-10-CM | POA: Diagnosis not present

## 2020-09-30 HISTORY — PX: POLYPECTOMY: SHX5525

## 2020-09-30 HISTORY — PX: COLONOSCOPY WITH PROPOFOL: SHX5780

## 2020-09-30 LAB — GLUCOSE, CAPILLARY: Glucose-Capillary: 201 mg/dL — ABNORMAL HIGH (ref 70–99)

## 2020-09-30 SURGERY — COLONOSCOPY WITH PROPOFOL
Anesthesia: General

## 2020-09-30 MED ORDER — PROPOFOL 500 MG/50ML IV EMUL
INTRAVENOUS | Status: DC | PRN
  Administered 2020-09-30: 150 ug/kg/min via INTRAVENOUS

## 2020-09-30 MED ORDER — LACTATED RINGERS IV SOLN: INTRAVENOUS | Status: DC | PRN

## 2020-09-30 MED ORDER — EPHEDRINE SULFATE 50 MG/ML IJ SOLN
INTRAMUSCULAR | Status: DC | PRN
  Administered 2020-09-30 (×2): 10 mg via INTRAVENOUS

## 2020-09-30 MED ORDER — PROPOFOL 10 MG/ML IV BOLUS
INTRAVENOUS | Status: AC
  Filled 2020-09-30: qty 20

## 2020-09-30 MED ORDER — LACTATED RINGERS IV SOLN: INTRAVENOUS | Status: DC

## 2020-09-30 MED ORDER — PHENYLEPHRINE 40 MCG/ML (10ML) SYRINGE FOR IV PUSH (FOR BLOOD PRESSURE SUPPORT)
PREFILLED_SYRINGE | INTRAVENOUS | Status: AC
  Filled 2020-09-30: qty 10

## 2020-09-30 MED ORDER — EPHEDRINE 5 MG/ML INJ
INTRAVENOUS | Status: AC
  Filled 2020-09-30: qty 10

## 2020-09-30 MED ORDER — STERILE WATER FOR IRRIGATION IR SOLN
Status: DC | PRN
  Administered 2020-09-30: 100 mL

## 2020-09-30 NOTE — Transfer of Care (Signed)
Immediate Anesthesia Transfer of Care Note  Patient: Edwin Burgess  Procedure(s) Performed: COLONOSCOPY WITH PROPOFOL (N/A ) POLYPECTOMY  Patient Location: PACU  Anesthesia Type:General  Level of Consciousness: awake, alert , oriented and patient cooperative  Airway & Oxygen Therapy: Patient Spontanous Breathing  Post-op Assessment: Report given to RN, Post -op Vital signs reviewed and stable and Patient moving all extremities X 4  Post vital signs: Reviewed and stable  Last Vitals:  Vitals Value Taken Time  BP    Temp    Pulse 97 09/30/20 0921  Resp 13 09/30/20 0921  SpO2 96 % 09/30/20 0921  Vitals shown include unvalidated device data.  Last Pain:  Vitals:   09/30/20 0858  TempSrc:   PainSc: 0-No pain      Patients Stated Pain Goal: 6 (09/30/20 0807)  Complications: No complications documented.

## 2020-09-30 NOTE — Progress Notes (Signed)
Dr Carver at bedside to talk with pt. Cleared for d/c home. 

## 2020-09-30 NOTE — H&P (Signed)
Primary Care Physician:  Monico Blitz, MD Primary Gastroenterologist:  Dr. Abbey Chatters  Pre-Procedure History & Physical: HPI:  Edwin Burgess is a 75 y.o. male is here for a colonoscopy to be performed for  History of colon polyps. Notes granfather with colon cancer.      No melena or hematochezia.  No abdominal pain or unintentional weight loss.  No change in bowel habits.  Overall feels well from a GI standpoint.  Past Medical History:  Diagnosis Date  . Asthma   . DM (diabetes mellitus) ([place])   . GERD (gastroesophageal reflux disease)   . Hyperlipidemia   . Personal history of colonic polyps   . Sleep apnea     Past Surgical History:  Procedure Laterality Date  . BIOPSY  02/01/2020   Procedure: BIOPSY;  Surgeon: Danie Binder, MD;  Location: [facility];  Service: Endoscopy;;  . CATARACT EXTRACTION, BILATERAL    . COLONOSCOPY  Aug 12/2005   dr.fleishman/mild diverticulosis. recommended to have next TCS 2012.  Marland Kitchen COLONOSCOPY  06/15/2012   [id number] internal hemorrhoids/ Mild diverticulosis/Eight sessile polyps measuring 2-6 mm (tubular adenomas). Surveillance in 5 years due to multiple adenomas  . COLONOSCOPY N/A 08/26/2017   One 2 mm polyps in ascending colon, seven 3-6 mm polyps in sigmoid, transverse, and cecum, diverticulosis in rectosigmoid, descending, and cecum, external and internal hemorrhoids. (seven simple adenomas and one hyperplastic polyp). 3 year surveillance   . ESOPHAGOGASTRODUODENOSCOPY  07/06/10   tight schatzki ring s/p dilation/small hiatal hernia  . ESOPHAGOGASTRODUODENOSCOPY N/A 02/01/2020    one benign-appearing intrinsic mild stenosis s/p dilation, mild gastritis, large non-bleeding duodenal diverticulum. Negative H.pylori.   Marland Kitchen POLYPECTOMY  08/26/2017   Procedure: POLYPECTOMY;  Surgeon: Danie Binder, MD;  Location: [facility];  Service: Endoscopy;;  cecal; transverse colon; ascending colon   . SAVORY DILATION N/A 02/01/2020   Procedure: SAVORY DILATION;   Surgeon: Danie Binder, MD;  Location: [facility];  Service: Endoscopy;  Laterality: N/A;    Prior to Admission medications   Medication Sig Start Date End Date Taking? Authorizing Provider  albuterol (PROVENTIL HFA;VENTOLIN HFA) 108 (90 BASE) MCG/ACT inhaler Inhale 2 puffs into the lungs every 6 (six) hours as needed for wheezing or shortness of breath. Asthma   Yes [provider]  aspirin 81 MG tablet Take 81 mg by mouth daily.   Yes [provider]  Cholecalciferol (VITAMIN D3) 125 MCG (5000 UT) TABS Take 5,000 Units by mouth daily.    Yes [provider]  Cyanocobalamin (VITAMIN B-12 PO) Take 2,000 mcg by mouth daily.   Yes [provider]  Fluticasone-Salmeterol (ADVAIR) 250-50 MCG/DOSE AEPB Inhale 1 puff into the lungs in the morning and at bedtime.    Yes [provider]  lisinopril (PRINIVIL,ZESTRIL) 2.5 MG tablet Take 2.5 mg by mouth daily.   Yes [provider]  loratadine (CLARITIN) 10 MG tablet Take 10 mg by mouth daily.   Yes [provider]  metFORMIN (GLUCOPHAGE) 500 MG tablet Take 1,000 mg by mouth 2 (two) times daily with a meal.  10/13/11  Yes [provider]  montelukast (SINGULAIR) 10 MG tablet Take 10 mg by mouth at bedtime.   Yes [provider]  Multiple Vitamin (MULTIVITAMIN) capsule Take 1 capsule by mouth daily.   Yes [provider]  olopatadine (PATANOL) 0.1 % ophthalmic solution Place 1 drop into both eyes daily as needed for allergies.   Yes [provider]  pantoprazole (  PROTONIX) 40 MG tablet Take 1 tablet (40 mg total) by mouth daily before breakfast. 08/08/20  Yes Gelene Mink, NP  rosuvastatin (CRESTOR) 10 MG tablet Take 10 mg by mouth every evening.    Yes [provider]  theophylline (THEODUR) 300 MG 12 hr tablet Take 300 mg by mouth 2 (two) times daily. 10/19/11  Yes [provider]    Allergies as of 08/08/2020 - Review Complete  08/08/2020  Allergen Reaction Noted  . Tetracycline Itching     Family History  Problem Relation Age of Onset  . Colon cancer Maternal Grandfather 91  . Breast cancer Maternal Aunt     Social History   Socioeconomic History  . Marital status: Married    Spouse name: Not on file  . Number of children: 2  . Years of education: Not on file  . Highest education level: Not on file  Occupational History  . Occupation: [employer]  . Occupation: retired Engineer, site  Tobacco Use  . Smoking status: Never Smoker  . Smokeless tobacco: Never Used  Vaping Use  . Vaping Use: Never used  Substance and Sexual Activity  . Alcohol use: No  . Drug use: No  . Sexual activity: Not on file  Other Topics Concern  . Not on file  Social History Narrative  . Not on file   Social Determinants of Health   Financial Resource Strain: Not on file  Food Insecurity: Not on file  Transportation Needs: Not on file  Physical Activity: Not on file  Stress: Not on file  Social Connections: Not on file  Intimate Partner Violence: Not on file    Review of Systems: See HPI, otherwise negative ROS  Impression/Plan: Edwin Burgess is here for a colonoscopy to be performed for  History of colon polyps.  The risks of the procedure including infection, bleed, or perforation as well as benefits, limitations, alternatives and imponderables have been reviewed with the patient. Questions have been answered. All parties agreeable.

## 2020-09-30 NOTE — Anesthesia Preprocedure Evaluation (Signed)
Anesthesia Evaluation  Patient identified by MRN, date of birth, ID band Patient awake    Reviewed: Allergy & Precautions, H&P , NPO status , Patient's Chart, lab work & pertinent test results, reviewed documented beta blocker date and time   Airway Mallampati: II  TM Distance: >3 FB Neck ROM: full    Dental no notable dental hx. (+) Teeth Intact   Pulmonary asthma , sleep apnea ,    Pulmonary exam normal breath sounds clear to auscultation       Cardiovascular Exercise Tolerance: Good negative cardio ROS   Rhythm:regular Rate:Normal     Neuro/Psych negative neurological ROS  negative psych ROS   GI/Hepatic Neg liver ROS, GERD  Medicated,  Endo/Other  negative endocrine ROSdiabetes  Renal/GU negative Renal ROS  negative genitourinary   Musculoskeletal   Abdominal   Peds  Hematology negative hematology ROS (+)   Anesthesia Other Findings   Reproductive/Obstetrics negative OB ROS                             Anesthesia Physical Anesthesia Plan  ASA: II  Anesthesia Plan: General   Post-op Pain Management:    Induction:   PONV Risk Score and Plan: Propofol infusion  Airway Management Planned:   Additional Equipment:   Intra-op Plan:   Post-operative Plan:   Informed Consent: I have reviewed the patients History and Physical, chart, labs and discussed the procedure including the risks, benefits and alternatives for the proposed anesthesia with the patient or authorized representative who has indicated his/her understanding and acceptance.     Dental Advisory Given  Plan Discussed with: CRNA  Anesthesia Plan Comments:         Anesthesia Quick Evaluation

## 2020-09-30 NOTE — Discharge Instructions (Addendum)
Colonoscopy Discharge Instructions  Read the instructions outlined below and refer to this sheet in the next few weeks. These discharge instructions provide you with general information on caring for yourself after you leave the hospital. Your doctor may also give you specific instructions. While your treatment has been planned according to the most current medical practices available, unavoidable complications occasionally occur.   ACTIVITY  You may resume your regular activity, but move at a slower pace for the next 24 hours.   Take frequent rest periods for the next 24 hours.   Walking will help get rid of the air and reduce the bloated feeling in your belly (abdomen).   No driving for 24 hours (because of the medicine (anesthesia) used during the test).    Do not sign any important legal documents or operate any machinery for 24 hours (because of the anesthesia used during the test).  NUTRITION  Drink plenty of fluids.   You may resume your normal diet as instructed by your doctor.   Begin with a light meal and progress to your normal diet. Heavy or fried foods are harder to digest and may make you feel sick to your stomach (nauseated).   Avoid alcoholic beverages for 24 hours or as instructed.  MEDICATIONS  You may resume your normal medications unless your doctor tells you otherwise.  WHAT YOU CAN EXPECT TODAY  Some feelings of bloating in the abdomen.   Passage of more gas than usual.   Spotting of blood in your stool or on the toilet paper.  IF YOU HAD POLYPS REMOVED DURING THE COLONOSCOPY:  No aspirin products for 7 days or as instructed.   No alcohol for 7 days or as instructed.   Eat a soft diet for the next 24 hours.  FINDING OUT THE RESULTS OF YOUR TEST Not all test results are available during your visit. If your test results are not back during the visit, make an appointment with your caregiver to find out the results. Do not assume everything is normal if  you have not heard from your caregiver or the medical facility. It is important for you to follow up on all of your test results.  SEEK IMMEDIATE MEDICAL ATTENTION IF:  You have more than a spotting of blood in your stool.   Your belly is swollen (abdominal distention).   You are nauseated or vomiting.   You have a temperature over 101.   You have abdominal pain or discomfort that is severe or gets worse throughout the day.   Your colonoscopy revealed 3 polyp(s) which I removed successfully. Await pathology results, my office will contact you. I recommend repeating colonoscopy in 5 years for surveillance purposes.   You also have diverticulosis and internal hemorrhoids. I would recommend increasing fiber in your diet or adding OTC Benefiber/Metamucil. Be sure to drink at least 4 to 6 glasses of water daily. Follow-up with GI as needed.  I hope you have a great rest of your week!  Elon Alas. Abbey Chatters, D.O. [facility]   Diverticulosis  Diverticulosis is a condition that develops when small pouches (diverticula) form in the wall of the large intestine (colon). The colon is where water is absorbed and stool (feces) is formed. The pouches form when the inside layer of the colon pushes through weak spots in the outer layers of the colon. You may have a few pouches or many of them. The pouches usually do not cause problems unless they  become inflamed or infected. When this happens, the condition is called diverticulitis. What are the causes? The cause of this condition is not known. What increases the risk? The following factors may make you more likely to develop this condition:  Being older than age 19. Your risk for this condition increases with age. Diverticulosis is rare among people younger than age 47. By age 37, many people have it.  Eating a low-fiber diet.  Having frequent constipation.  Being overweight.  Not  getting enough exercise.  Smoking.  Taking over-the-counter pain medicines, like aspirin and ibuprofen.  Having a family history of diverticulosis. What are the signs or symptoms? In most people, there are no symptoms of this condition. If you do have symptoms, they may include:  Bloating.  Cramps in the abdomen.  Constipation or diarrhea.  Pain in the lower left side of the abdomen. How is this diagnosed? Because diverticulosis usually has no symptoms, it is most often diagnosed during an exam for other colon problems. The condition may be diagnosed by:  Using a flexible scope to examine the colon (colonoscopy).  Taking an X-ray of the colon after dye has been put into the colon (barium enema).  Having a CT scan. How is this treated? You may not need treatment for this condition. Your health care provider may recommend treatment to prevent problems. You may need treatment if you have symptoms or if you previously had diverticulitis. Treatment may include:  Eating a high-fiber diet.  Taking a fiber supplement.  Taking a live bacteria supplement (probiotic).  Taking medicine to relax your colon. Follow these instructions at home: Medicines  Take over-the-counter and prescription medicines only as told by your health care provider.  If told by your health care provider, take a fiber supplement or probiotic. Constipation prevention Your condition may cause constipation. To prevent or treat constipation, you may need to:  Drink enough fluid to keep your urine pale yellow.  Take over-the-counter or prescription medicines.  Eat foods that are high in fiber, such as beans, whole grains, and fresh fruits and vegetables.  Limit foods that are high in fat and processed sugars, such as fried or sweet foods.  General instructions  Try not to strain when you have a bowel movement.  Keep all follow-up visits as told by your health care provider. This is important. Contact a  health care provider if you:  Have pain in your abdomen.  Have bloating.  Have cramps.  Have not had a bowel movement in 3 days. Get help right away if:  Your pain gets worse.  Your bloating becomes very bad.  You have a fever or chills, and your symptoms suddenly get worse.  You vomit.  You have bowel movements that are bloody or black.  You have bleeding from your rectum. Summary  Diverticulosis is a condition that develops when small pouches (diverticula) form in the wall of the large intestine (colon).  You may have a few pouches or many of them.  This condition is most often diagnosed during an exam for other colon problems.  Treatment may include increasing the fiber in your diet, taking supplements, or taking medicines. This information is not intended to replace advice given to you by your health care provider. Make sure you discuss any questions you have with your health care provider. Document Revised: 04/12/2019 Document Reviewed: 04/12/2019 Elsevier Patient Education  2020 [employer].   Hemorrhoids Hemorrhoids are swollen veins that may develop:  In the butt (  rectum). These are called internal hemorrhoids.  Around the opening of the butt (anus). These are called external hemorrhoids. Hemorrhoids can cause pain, itching, or bleeding. Most of the time, they do not cause serious problems. They usually get better with diet changes, lifestyle changes, and other home treatments. What are the causes? This condition may be caused by:  Having trouble pooping (constipation).  Pushing hard (straining) to poop.  Watery poop (diarrhea).  Pregnancy.  Being very overweight (obese).  Sitting for long periods of time.  Heavy lifting or other activity that causes you to strain.  Anal sex.  Riding a bike for a long period of time. What are the signs or symptoms? Symptoms of this condition include:  Pain.  Itching or soreness in the butt.  Bleeding  from the butt.  Leaking poop.  Swelling in the area.  One or more lumps around the opening of your butt. How is this diagnosed? A doctor can often diagnose this condition by looking at the affected area. The doctor may also:  Do an exam that involves feeling the area with a gloved hand (digital rectal exam).  Examine the area inside your butt using a small tube (anoscope).  Order blood tests. This may be done if you have lost a lot of blood.  Have you get a test that involves looking inside the colon using a flexible tube with a camera on the end (sigmoidoscopy or colonoscopy). How is this treated? This condition can usually be treated at home. Your doctor may tell you to change what you eat, make lifestyle changes, or try home treatments. If these do not help, procedures can be done to remove the hemorrhoids or make them smaller. These may involve:  Placing rubber bands at the base of the hemorrhoids to cut off their blood supply.  Injecting medicine into the hemorrhoids to shrink them.  Shining a type of light energy onto the hemorrhoids to cause them to fall off.  Doing surgery to remove the hemorrhoids or cut off their blood supply. Follow these instructions at home: Eating and drinking   Eat foods that have a lot of fiber in them. These include whole grains, beans, nuts, fruits, and vegetables.  Ask your doctor about taking products that have added fiber (fibersupplements).  Reduce the amount of fat in your diet. You can do this by: ? Eating low-fat dairy products. ? Eating less red meat. ? Avoiding processed foods.  Drink enough fluid to keep your pee (urine) pale yellow. Managing pain and swelling   Take a warm-water bath (sitz bath) for 20 minutes to ease pain. Do this 3-4 times a day. You may do this in a bathtub or using a portable sitz bath that fits over the toilet.  If told, put ice on the painful area. It may be helpful to use ice between your warm  baths. ? Put ice in a plastic bag. ? Place a towel between your skin and the bag. ? Leave the ice on for 20 minutes, 2-3 times a day. General instructions  Take over-the-counter and prescription medicines only as told by your doctor. ? Medicated creams and medicines may be used as told.  Exercise often. Ask your doctor how much and what kind of exercise is best for you.  Go to the bathroom when you have the urge to poop. Do not wait.  Avoid pushing too hard when you poop.  Keep your butt dry and clean. Use wet toilet paper or moist towelettes  after pooping.  Do not sit on the toilet for a long time.  Keep all follow-up visits as told by your doctor. This is important. Contact a doctor if you:  Have pain and swelling that do not get better with treatment or medicine.  Have trouble pooping.  Cannot poop.  Have pain or swelling outside the area of the hemorrhoids. Get help right away if you have:  Bleeding that will not stop. Summary  Hemorrhoids are swollen veins in the butt or around the opening of the butt.  They can cause pain, itching, or bleeding.  Eat foods that have a lot of fiber in them. These include whole grains, beans, nuts, fruits, and vegetables.  Take a warm-water bath (sitz bath) for 20 minutes to ease pain. Do this 3-4 times a day. This information is not intended to replace advice given to you by your health care provider. Make sure you discuss any questions you have with your health care provider. Document Revised: 09/21/2018 Document Reviewed: 02/02/2018 Elsevier Patient Education  2020 [employer]   Monitored Anesthesia Care, Care After These instructions provide you with information about caring for yourself after your procedure. Your health care provider may also give you more specific instructions. Your treatment has been planned according to current medical practices, but problems sometimes occur. Call your health care provider if you have any  problems or questions after your procedure. What can I expect after the procedure? After your procedure, you may:  Feel sleepy for several hours.  Feel clumsy and have poor balance for several hours.  Feel forgetful about what happened after the procedure.  Have poor judgment for several hours.  Feel nauseous or vomit.  Have a sore throat if you had a breathing tube during the procedure. Follow these instructions at home: For at least 24 hours after the procedure:      Have a responsible adult stay with you. It is important to have someone help care for you until you are awake and alert.  Rest as needed.  Do not: ? Participate in activities in which you could fall or become injured. ? Drive. ? Use heavy machinery. ? Drink alcohol. ? Take sleeping pills or medicines that cause drowsiness. ? Make important decisions or sign legal documents. ? Take care of children on your own. Eating and drinking  Follow the diet that is recommended by your health care provider.  If you vomit, drink water, juice, or soup when you can drink without vomiting.  Make sure you have little or no nausea before eating solid foods. General instructions  Take over-the-counter and prescription medicines only as told by your health care provider.  If you have sleep apnea, surgery and certain medicines can increase your risk for breathing problems. Follow instructions from your health care provider about wearing your sleep device: ? Anytime you are sleeping, including during daytime naps. ? While taking prescription pain medicines, sleeping medicines, or medicines that make you drowsy.  If you smoke, do not smoke without supervision.  Keep all follow-up visits as told by your health care provider. This is important. Contact a health care provider if:  You keep feeling nauseous or you keep vomiting.  You feel light-headed.  You develop a rash.  You have a fever. Get help right away  if:  You have trouble breathing. Summary  For several hours after your procedure, you may feel sleepy and have poor judgment.  Have a responsible adult stay with you for at  least 24 hours or until you are awake and alert. This information is not intended to replace advice given to you by your health care provider. Make sure you discuss any questions you have with your health care provider. Document Revised: 12/12/2017 Document Reviewed: 01/04/2016 Elsevier Patient Education  2020 [employer].

## 2020-09-30 NOTE — Anesthesia Postprocedure Evaluation (Signed)
Anesthesia Post Note  Patient: Edwin Burgess  Procedure(s) Performed: COLONOSCOPY WITH PROPOFOL (N/A ) POLYPECTOMY  Patient location during evaluation: PACU Anesthesia Type: General Level of consciousness: awake, oriented, awake and alert and patient cooperative Pain management: satisfactory to patient Vital Signs Assessment: post-procedure vital signs reviewed and stable Respiratory status: spontaneous breathing, respiratory function stable and nonlabored ventilation Cardiovascular status: stable Postop Assessment: no apparent nausea or vomiting Anesthetic complications: no   No complications documented.   Last Vitals:  Vitals:   09/30/20 0807  BP: 134/70  Pulse: 89  Resp: 14  Temp: 36.8 C  SpO2: 95%    Last Pain:  Vitals:   09/30/20 0858  TempSrc:   PainSc: 0-No pain                 Annah Jasko

## 2020-09-30 NOTE — Op Note (Signed)
[facility] Patient Name: Edwin Burgess Procedure Date: 09/30/2020 7:07 AM MRN: [id number] Date of Birth: 03-Jun-1946 Attending MD: Elon Alas. Edgar Frisk CSN: [id number] Age: 75 Admit Type: Outpatient Procedure:                Colonoscopy Indications:              High risk colon cancer surveillance: Personal                            history of colonic polyps Providers:                Elon Alas. Abbey Chatters, DO, Lambert Mody, Aram Candela Referring MD:              Medicines:                See the Anesthesia note for documentation of the                            administered medications Complications:            No immediate complications. Estimated Blood Loss:     Estimated blood loss was minimal. Procedure:                Pre-Anesthesia Assessment:                           - The anesthesia plan was to use monitored                            anesthesia care (MAC).                           After obtaining informed consent, the colonoscope                            was passed under direct vision. Throughout the                            procedure, the patient's blood pressure, pulse, and                            oxygen saturations were monitored continuously. The                            [id number]-HQ190L ([id number]) scope was introduced through                            the anus and advanced to the the cecum, identified                            by appendiceal orifice and ileocecal valve. The                            colonoscopy was performed without difficulty. The  patient tolerated the procedure well. The quality                            of the bowel preparation was evaluated using the                            BBPS [facility] Bowel Preparation Scale) with scores                            of: Right Colon = 2 (minor amount of residual                            staining, small fragments of stool and/or opaque                             liquid, but mucosa seen well), Transverse Colon = 3                            (entire mucosa seen well with no residual staining,                            small fragments of stool or opaque liquid) and Left                            Colon = 3 (entire mucosa seen well with no residual                            staining, small fragments of stool or opaque                            liquid). The total BBPS score equals 8. The quality                            of the bowel preparation was good. Scope In: 8:59:07 AM Scope Out: 9:14:35 AM Scope Withdrawal Time: 0 hours 10 minutes 38 seconds  Total Procedure Duration: 0 hours 15 minutes 28 seconds  Findings:      The perianal and digital rectal examinations were normal.      Non-bleeding internal hemorrhoids were found during endoscopy.      Multiple small and large-mouthed diverticula were found in the sigmoid       colon and descending colon.      A 8 mm polyp was found in the ascending colon. The polyp was sessile.       The polyp was removed with a cold snare. Resection and retrieval were       complete.      Two sessile polyps were found in the sigmoid colon. The polyps were 3 to       4 mm in size. These polyps were removed with a cold snare. Resection and       retrieval were complete.      The colon (entire examined portion) revealed moderately excessive       looping. Advancing the scope required applying abdominal pressure. Impression:               -  Non-bleeding internal hemorrhoids.                           - Diverticulosis in the sigmoid colon and in the                            descending colon.                           - One 8 mm polyp in the ascending colon, removed                            with a cold snare. Resected and retrieved.                           - Two 3 to 4 mm polyps in the sigmoid colon,                            removed with a cold snare. Resected and retrieved.                            - There was significant looping of the colon. Moderate Sedation:      Per Anesthesia Care Recommendation:           - Patient has a contact number available for                            emergencies. The signs and symptoms of potential                            delayed complications were discussed with the                            patient. Return to normal activities tomorrow.                            Written discharge instructions were provided to the                            patient.                           - Resume previous diet.                           - Continue present medications.                           - Await pathology results.                           - Repeat colonoscopy in 5 years for surveillance.                           - Return to GI clinic PRN. Procedure Code(s):        ---  Professional ---                           [phone], Colonoscopy, flexible; with removal of                            tumor(s), polyp(s), or other lesion(s) by snare                            technique Diagnosis Code(s):        --- Professional ---                           K63.5, Polyp of colon                           Z86.010, Personal history of colonic polyps                           K64.8, Other hemorrhoids                           K57.30, Diverticulosis of large intestine without                            perforation or abscess without bleeding CPT copyright 2019 American Medical Association. All rights reserved. The codes documented in this report are preliminary and upon coder review may  be revised to meet current compliance requirements. Elon Alas. Abbey Chatters, DO [place] Abbey Chatters, DO 09/30/2020 9:20:25 AM This report has been signed electronically. Number of Addenda: 0

## 2020-10-01 LAB — SURGICAL PATHOLOGY

## 2020-10-03 ENCOUNTER — Encounter (HOSPITAL_COMMUNITY): Payer: Self-pay | Admitting: Internal Medicine

## 2020-11-24 DIAGNOSIS — E1165 Type 2 diabetes mellitus with hyperglycemia: Secondary | ICD-10-CM | POA: Diagnosis not present

## 2020-11-24 DIAGNOSIS — R030 Elevated blood-pressure reading, without diagnosis of hypertension: Secondary | ICD-10-CM | POA: Diagnosis not present

## 2020-11-24 DIAGNOSIS — Z6834 Body mass index (BMI) 34.0-34.9, adult: Secondary | ICD-10-CM | POA: Diagnosis not present

## 2020-11-24 DIAGNOSIS — Z789 Other specified health status: Secondary | ICD-10-CM | POA: Diagnosis not present

## 2020-11-24 DIAGNOSIS — G4730 Sleep apnea, unspecified: Secondary | ICD-10-CM | POA: Diagnosis not present

## 2020-11-24 DIAGNOSIS — Z299 Encounter for prophylactic measures, unspecified: Secondary | ICD-10-CM | POA: Diagnosis not present

## 2020-11-24 DIAGNOSIS — I10 Essential (primary) hypertension: Secondary | ICD-10-CM | POA: Diagnosis not present

## 2021-01-21 DIAGNOSIS — Z1331 Encounter for screening for depression: Secondary | ICD-10-CM | POA: Diagnosis not present

## 2021-01-21 DIAGNOSIS — Z6833 Body mass index (BMI) 33.0-33.9, adult: Secondary | ICD-10-CM | POA: Diagnosis not present

## 2021-01-21 DIAGNOSIS — I10 Essential (primary) hypertension: Secondary | ICD-10-CM | POA: Diagnosis not present

## 2021-01-21 DIAGNOSIS — Z125 Encounter for screening for malignant neoplasm of prostate: Secondary | ICD-10-CM | POA: Diagnosis not present

## 2021-01-21 DIAGNOSIS — Z299 Encounter for prophylactic measures, unspecified: Secondary | ICD-10-CM | POA: Diagnosis not present

## 2021-01-21 DIAGNOSIS — Z79899 Other long term (current) drug therapy: Secondary | ICD-10-CM | POA: Diagnosis not present

## 2021-01-21 DIAGNOSIS — E669 Obesity, unspecified: Secondary | ICD-10-CM | POA: Diagnosis not present

## 2021-01-21 DIAGNOSIS — Z0000 Encounter for general adult medical examination without abnormal findings: Secondary | ICD-10-CM | POA: Diagnosis not present

## 2021-01-21 DIAGNOSIS — E7800 Pure hypercholesterolemia, unspecified: Secondary | ICD-10-CM | POA: Diagnosis not present

## 2021-01-21 DIAGNOSIS — R5383 Other fatigue: Secondary | ICD-10-CM | POA: Diagnosis not present

## 2021-01-21 DIAGNOSIS — E1165 Type 2 diabetes mellitus with hyperglycemia: Secondary | ICD-10-CM | POA: Diagnosis not present

## 2021-01-21 DIAGNOSIS — Z7189 Other specified counseling: Secondary | ICD-10-CM | POA: Diagnosis not present

## 2021-01-21 DIAGNOSIS — Z1339 Encounter for screening examination for other mental health and behavioral disorders: Secondary | ICD-10-CM | POA: Diagnosis not present

## 2021-02-25 DIAGNOSIS — Z6833 Body mass index (BMI) 33.0-33.9, adult: Secondary | ICD-10-CM | POA: Diagnosis not present

## 2021-02-25 DIAGNOSIS — E1165 Type 2 diabetes mellitus with hyperglycemia: Secondary | ICD-10-CM | POA: Diagnosis not present

## 2021-02-25 DIAGNOSIS — I10 Essential (primary) hypertension: Secondary | ICD-10-CM | POA: Diagnosis not present

## 2021-02-25 DIAGNOSIS — Z299 Encounter for prophylactic measures, unspecified: Secondary | ICD-10-CM | POA: Diagnosis not present

## 2021-03-18 ENCOUNTER — Encounter: Payer: Self-pay | Admitting: Gastroenterology

## 2021-03-18 ENCOUNTER — Other Ambulatory Visit: Payer: Self-pay

## 2021-03-18 ENCOUNTER — Ambulatory Visit: Payer: Medicare PPO | Admitting: Gastroenterology

## 2021-03-18 VITALS — BP 136/66 | HR 87 | Temp 97.3°F | Ht 66.0 in | Wt 188.2 lb

## 2021-03-18 DIAGNOSIS — K219 Gastro-esophageal reflux disease without esophagitis: Secondary | ICD-10-CM

## 2021-03-18 NOTE — Patient Instructions (Signed)
Continue Protonix once daily. Follow up in 8-12 months, or sooner if needed.   It was a pleasure providing care to you today!

## 2021-03-18 NOTE — Progress Notes (Signed)
Referring Provider: Monico Blitz, MD Primary Care Physician:  Monico Blitz, MD Primary GI Physician: Abbey Chatters  Chief Complaint  Patient presents with   Gastroesophageal Reflux    Doing fine. No trouble swallowing   HPI:   Edwin Burgess is a 75 y.o. male presenting today for follow up of GERD and dysphagia.   GERD: Doing well on once daily protonix. No breakthrough reflux symptoms.   Dysphagia: History of dysphagia previously that resolved s/p esophageal dilation during EGD in May 2021. He reports that he drinks lots of water when taking his medications and has no issues with pill dysphagia as long as he does this.  Denies nausea, vomiting, diarrhea, constipation, BRBPR, melena, early satiety, bloating or abdominal pain.   Last Colonoscopy:09/30/20 Non-bleeding internal hemorrhoids. Diverticulosis in the sigmoid colon and in the descending colon. One 8 mm polyp in the ascending colon, removed (tubular adenoma).. Two 3 to 4 mm polyps in the sigmoid colon, removed, Resected and retrieved (tubular adenoma). There was significant looping of the colon.  Last Endoscopy:02/01/20 Benign appearing esophageal stricture, dilated. MILD Gastritis DUE TO ASA. Biopsied (no specific histopathological changes). Non-bleeding duodenal diverticulum. Negative H. Pylori  Recommendations:  Surveillance Colonoscopy in 2027  Past Medical History:  Diagnosis Date   Asthma    DM (diabetes mellitus) ([employer])    GERD (gastroesophageal reflux disease)    Hyperlipidemia    Personal history of colonic polyps    Sleep apnea     Past Surgical History:  Procedure Laterality Date   BIOPSY  02/01/2020   Procedure: BIOPSY;  Surgeon: Danie Binder, MD;  [facility];  Service: Endoscopy;;   CATARACT EXTRACTION, BILATERAL     COLONOSCOPY  04/30/2006   dr.fleishman/mild diverticulosis. recommended to have next TCS 2012.   COLONOSCOPY  06/15/2012   [id number] internal hemorrhoids/ Mild  diverticulosis/Eight sessile polyps measuring 2-6 mm (tubular adenomas). Surveillance in 5 years due to multiple adenomas   COLONOSCOPY N/A 08/26/2017   One 2 mm polyps in ascending colon, seven 3-6 mm polyps in sigmoid, transverse, and cecum, diverticulosis in rectosigmoid, descending, and cecum, external and internal hemorrhoids. (seven simple adenomas and one hyperplastic polyp). 3 year surveillance    COLONOSCOPY WITH PROPOFOL N/A 09/30/2020   Non-bleeding internal hemorrhoids. Diverticulosis in the sigmoid colon and in the descending colon. One 8 mm polyp in the ascending colon, removed (tubular adenoma).. Two 3 to 4 mm polyps in the sigmoid colon, removed, Resected and retrieved (tubular adenoma). There was significant looping of the colon.   ESOPHAGOGASTRODUODENOSCOPY  07/06/2010   tight schatzki ring s/p dilation/small hiatal hernia   ESOPHAGOGASTRODUODENOSCOPY N/A 02/01/2020    one benign-appearing intrinsic mild stenosis s/p dilation, mild gastritis, large non-bleeding duodenal diverticulum. Negative H.pylori.    POLYPECTOMY  08/26/2017   Procedure: POLYPECTOMY;  Surgeon: Danie Binder, MD;  [facility];  Service: Endoscopy;;  cecal; transverse colon; ascending colon    POLYPECTOMY  09/30/2020   Procedure: POLYPECTOMY;  Surgeon: Eloise Harman, DO;  [facility];  Service: Endoscopy;;   SAVORY DILATION N/A 02/01/2020   Procedure: SAVORY DILATION;  Surgeon: Danie Binder, MD;  [facility];  Service: Endoscopy;  Laterality: N/A;    Current Outpatient Medications  Medication Sig Dispense Refill   albuterol (PROVENTIL HFA;VENTOLIN HFA) 108 (90 BASE) MCG/ACT inhaler Inhale 2 puffs into the lungs every 6 (six) hours as needed for wheezing or shortness of breath. Asthma     aspirin  81 MG tablet Take 81 mg by mouth daily.     Cholecalciferol (VITAMIN D3) 125 MCG (5000 UT) TABS Take 5,000 Units by mouth daily.      Cyanocobalamin (VITAMIN B-12 PO) Take  2,000 mcg by mouth daily.     Fluticasone-Salmeterol (ADVAIR) 250-50 MCG/DOSE AEPB Inhale 1 puff into the lungs in the morning and at bedtime.      lisinopril (PRINIVIL,ZESTRIL) 2.5 MG tablet Take 2.5 mg by mouth daily.     loratadine (CLARITIN) 10 MG tablet Take 10 mg by mouth daily.     metFORMIN (GLUCOPHAGE) 500 MG tablet Take 1,000 mg by mouth 2 (two) times daily with a meal.      montelukast (SINGULAIR) 10 MG tablet Take 10 mg by mouth at bedtime.     Multiple Vitamin (MULTIVITAMIN) capsule Take 1 capsule by mouth daily.     olopatadine (PATANOL) 0.1 % ophthalmic solution Place 1 drop into both eyes daily as needed for allergies.     pantoprazole (PROTONIX) 40 MG tablet Take 1 tablet (40 mg total) by mouth daily before breakfast. 90 tablet 3   rosuvastatin (CRESTOR) 10 MG tablet Take 10 mg by mouth every evening.      theophylline (THEODUR) 300 MG 12 hr tablet Take 300 mg by mouth 2 (two) times daily.     No current facility-administered medications for this visit.    Allergies as of 03/18/2021 - Review Complete 03/18/2021  Allergen Reaction Noted   Tetracycline Itching     Family History  Problem Relation Age of Onset   Colon cancer Maternal Grandfather 91   Breast cancer Maternal Aunt     Social History   Socioeconomic History   Marital status: Married    Spouse name: Not on file   Number of children: 2   Years of education: Not on file   Highest education level: Not on file  Occupational History   Occupation: Designer, television/film set   Occupation: retired Education officer, museum  Tobacco Use   Smoking status: Never   Smokeless tobacco: Never  Scientific laboratory technician Use: Never used  Substance and Sexual Activity   Alcohol use: No   Drug use: No   Sexual activity: Not on file  Other Topics Concern   Not on file  Social History Narrative   Not on file   Social Determinants of Health   Financial Resource Strain: Not on file  Food Insecurity: Not on file  Transportation Needs:  Not on file  Physical Activity: Not on file  Stress: Not on file  Social Connections: Not on file    Review of Systems: Gen: Denies fever, chills, anorexia. Denies fatigue, weakness, weight loss.  CV: Denies chest pain, palpitations, syncope, peripheral edema, and claudication. Resp: Denies dyspnea at rest, cough, wheezing, coughing up blood, and pleurisy. GI: Denies vomiting blood, jaundice, and fecal incontinence. Denies dysphagia or odynophagia. Derm: Denies rash, itching, dry skin Psych: Denies depression, anxiety, memory loss, confusion. No homicidal or suicidal ideation.  Heme: Denies bruising, bleeding, and enlarged lymph nodes.  Physical Exam: BP 136/66   Pulse 87   Temp (!) 97.3 F (36.3 C)   Ht 5\' 6"  (1.676 m)   Wt 188 lb 3.2 oz (85.4 kg)   BMI 30.38 kg/m  General:   Alert and oriented. No distress noted. Pleasant and cooperative.  Head:  Normocephalic and atraumatic. Eyes:  Conjuctiva clear without scleral icterus. Mouth:  Oral mucosa pink and moist. Good dentition. No lesions. Heart:  Normal rate and rhythm, s1 and s2 heart sounds present.  Lungs: Clear lung sounds in all lobes. Respirations equal and unlabored. Abdomen:  +BS, soft, non-tender and non-distended. No rebound or guarding. No HSM or masses noted. Derm: No palmar erythema or jaundice Msk:  Symmetrical without gross deformities. Normal posture. Extremities:  Without edema. Neurologic:  Alert and  oriented x4 Psych:  Alert and cooperative. Normal mood and affect.  ASSESSMENT: Edwin Burgess is a 75 y.o. male presenting today with follow up of GERD and dysphagia. Reports he is doing well on protonix once daily, no breakthrough reflux symptoms. Dysphagia resolved s/p esophageal dilation during EGD 01/2020. He reports he drinks plenty of water when taking his pills and doesn't seem to have any issues with dysphagia as long as he does this. No alarm symptoms present, patient doing well overall.    PLAN:   Continue protonix once daily with good result 2. Continue drinking plenty of water with pills to avoid pill dysphagia.   Follow Up: 8-12 months  Chelsea L. Alver Sorrow, MSN, APRN, AGNP-C Adult-Gerontology Nurse Practitioner [facility]  Patient was seen in conjunction with Scherrie Gerlach, MSN, APRN, AGNP-C. Agree with all above. Surveillance colonoscopy will be due in 2027 as long as health permits.  Annitta Needs, PhD, ANP-BC [facility]

## 2021-04-23 DIAGNOSIS — M79676 Pain in unspecified toe(s): Secondary | ICD-10-CM | POA: Diagnosis not present

## 2021-04-23 DIAGNOSIS — L84 Corns and callosities: Secondary | ICD-10-CM | POA: Diagnosis not present

## 2021-04-23 DIAGNOSIS — B351 Tinea unguium: Secondary | ICD-10-CM | POA: Diagnosis not present

## 2021-04-23 DIAGNOSIS — E1142 Type 2 diabetes mellitus with diabetic polyneuropathy: Secondary | ICD-10-CM | POA: Diagnosis not present

## 2021-05-25 DIAGNOSIS — Z299 Encounter for prophylactic measures, unspecified: Secondary | ICD-10-CM | POA: Diagnosis not present

## 2021-05-25 DIAGNOSIS — J069 Acute upper respiratory infection, unspecified: Secondary | ICD-10-CM | POA: Diagnosis not present

## 2021-05-25 DIAGNOSIS — R350 Frequency of micturition: Secondary | ICD-10-CM | POA: Diagnosis not present

## 2021-05-25 DIAGNOSIS — Z6833 Body mass index (BMI) 33.0-33.9, adult: Secondary | ICD-10-CM | POA: Diagnosis not present

## 2021-05-25 DIAGNOSIS — Z713 Dietary counseling and surveillance: Secondary | ICD-10-CM | POA: Diagnosis not present

## 2021-06-02 DIAGNOSIS — Z713 Dietary counseling and surveillance: Secondary | ICD-10-CM | POA: Diagnosis not present

## 2021-06-02 DIAGNOSIS — Z299 Encounter for prophylactic measures, unspecified: Secondary | ICD-10-CM | POA: Diagnosis not present

## 2021-06-02 DIAGNOSIS — R0789 Other chest pain: Secondary | ICD-10-CM | POA: Diagnosis not present

## 2021-06-02 DIAGNOSIS — Z6832 Body mass index (BMI) 32.0-32.9, adult: Secondary | ICD-10-CM | POA: Diagnosis not present

## 2021-06-02 DIAGNOSIS — I10 Essential (primary) hypertension: Secondary | ICD-10-CM | POA: Diagnosis not present

## 2021-06-02 DIAGNOSIS — R110 Nausea: Secondary | ICD-10-CM | POA: Diagnosis not present

## 2021-06-02 DIAGNOSIS — E1165 Type 2 diabetes mellitus with hyperglycemia: Secondary | ICD-10-CM | POA: Diagnosis not present

## 2021-06-05 DIAGNOSIS — R1011 Right upper quadrant pain: Secondary | ICD-10-CM | POA: Diagnosis not present

## 2021-06-05 DIAGNOSIS — R059 Cough, unspecified: Secondary | ICD-10-CM | POA: Diagnosis not present

## 2021-06-05 DIAGNOSIS — R079 Chest pain, unspecified: Secondary | ICD-10-CM | POA: Diagnosis not present

## 2021-06-16 ENCOUNTER — Other Ambulatory Visit: Payer: Self-pay

## 2021-06-16 ENCOUNTER — Ambulatory Visit (INDEPENDENT_AMBULATORY_CARE_PROVIDER_SITE_OTHER): Payer: Self-pay | Admitting: *Deleted

## 2021-06-16 VITALS — Ht 66.0 in | Wt 180.0 lb

## 2021-06-16 DIAGNOSIS — Z6830 Body mass index (BMI) 30.0-30.9, adult: Secondary | ICD-10-CM | POA: Diagnosis not present

## 2021-06-16 DIAGNOSIS — Z299 Encounter for prophylactic measures, unspecified: Secondary | ICD-10-CM | POA: Diagnosis not present

## 2021-06-16 DIAGNOSIS — I10 Essential (primary) hypertension: Secondary | ICD-10-CM | POA: Diagnosis not present

## 2021-06-16 DIAGNOSIS — R1310 Dysphagia, unspecified: Secondary | ICD-10-CM | POA: Diagnosis not present

## 2021-06-16 DIAGNOSIS — E7800 Pure hypercholesterolemia, unspecified: Secondary | ICD-10-CM | POA: Diagnosis not present

## 2021-06-16 DIAGNOSIS — R350 Frequency of micturition: Secondary | ICD-10-CM | POA: Diagnosis not present

## 2021-06-16 NOTE — Progress Notes (Signed)
Noted.  Instructions were reviewed and given to pt.  Orders placed and auth approved.

## 2021-06-16 NOTE — Progress Notes (Signed)
Burgess, Edwin Estimable, MD  Metro Kung, RMA Okay to schedule with conscious sedation.  He would be an ASA 3.  Thanks.

## 2021-06-16 NOTE — Progress Notes (Addendum)
Gastroenterology Pre-Procedure Review  Request Date: 06/16/2021 Requesting Physician: Last EGD done 02/01/2020 by Dr. Oneida Alar, benign appearing esophageal stricture, triage per Dr. Gala Romney, dysphagia  PATIENT REVIEW QUESTIONS: The patient responded to the following health history questions as indicated:    1. Diabetes Melitis: yes, type II  2. Joint replacements in the past 12 months: no 3. Major health problems in the past 3 months: no 4. Has an artificial valve or MVP: no 5. Has a defibrillator: no 6. Has been advised in past to take antibiotics in advance of a procedure like teeth cleaning: no 7. Family history of colon cancer: no  8. Alcohol Use: no 9. Illicit drug Use: no 10. History of sleep apnea: yes, but doesn't use CPAP  11. History of coronary artery or other vascular stents placed within the last 12 months: no 12. History of any prior anesthesia complications: no 13. Body mass index is 29.05 kg/m.    MEDICATIONS & ALLERGIES:    Patient reports the following regarding taking any blood thinners:   Plavix? no Aspirin? Yes, 81 mg Coumadin? no Brilinta? no Xarelto? no Eliquis? no Pradaxa? no Savaysa? no Effient? no  Patient confirms/reports the following medications:  Current Outpatient Medications  Medication Sig Dispense Refill   albuterol (PROVENTIL HFA;VENTOLIN HFA) 108 (90 BASE) MCG/ACT inhaler Inhale 2 puffs into the lungs as needed for wheezing or shortness of breath. Asthma     aspirin 81 MG tablet Take 81 mg by mouth daily.     Fluticasone-Salmeterol (ADVAIR) 250-50 MCG/DOSE AEPB Inhale 1 puff into the lungs at bedtime.     lisinopril (PRINIVIL,ZESTRIL) 2.5 MG tablet Take 2.5 mg by mouth daily.     loratadine (CLARITIN) 10 MG tablet Take 10 mg by mouth daily.     metFORMIN (GLUCOPHAGE) 500 MG tablet Take 500 mg by mouth 2 (two) times daily with a meal. Takes 2 tablets twice daily.  (2000 mg daily)     montelukast (SINGULAIR) 10 MG tablet Take 10 mg by mouth at  bedtime.     Multiple Vitamin (MULTIVITAMIN) capsule Take 1 capsule by mouth daily.     olopatadine (PATANOL) 0.1 % ophthalmic solution Place 1 drop into both eyes daily as needed for allergies.     pantoprazole (PROTONIX) 40 MG tablet Take 1 tablet (40 mg total) by mouth daily before breakfast. 90 tablet 3   rosuvastatin (CRESTOR) 10 MG tablet Take 10 mg by mouth every evening.      theophylline (THEODUR) 300 MG 12 hr tablet Take 300 mg by mouth 2 (two) times daily.     No current facility-administered medications for this visit.    Patient confirms/reports the following allergies:  Allergies  Allergen Reactions   Amoxicillin Nausea And Vomiting   Tetracycline Itching    No orders of the defined types were placed in this encounter.   AUTHORIZATION INFORMATION Primary Insurance: [place],  ID #: [id number],  Group #: [id number] Pre-Cert / Auth required: Yes, approved [id number] Pre-Cert / Josem Kaufmann #: [id number]  SCHEDULE INFORMATION: Procedure has been scheduled as follows:  Date: 06/17/2021, Time: 10:30  Location: [facility] with Dr. Gala Romney  This Gastroenterology Pre-Precedure Review Form is being routed to the following provider(s): Dr. Gala Romney

## 2021-06-16 NOTE — Patient Instructions (Signed)
  Procedure Day /Date: 06/17/2021 Arrival Time: 9:30 am  Location of Procedure: [facility]  PREPARATION FOR ENDOSCOPY    Please notify us immediately if you are diabetic, take iron supplements, or if you are on Coumadin or any other blood thinners.   Weight loss medications must be held 7 days prior to your procedure.   Please notify us of any medication changes at least 7 days prior to your procedure.   06/16/2021-You may have you regular food and drink until 6:00 pm.  Between 6:00 pm and midnight you can only have clear liquids, NO SOLID FOODS.  Nothing to eat or drink after midnight.    You can take your medication as normal (with a sip of water) unless something is listed below to hold or adjust.   Please stop the following medicine:  Pt says he is unable to take any meds due to swallowing (has not taken in 2 days)    CLEAR LIQUIDS INCLUDE(see list below): NOTHING RED IN COLOR Water Jello  Ice Popsicles  Tea (sugar ok, no milk/cream) Powdered fruit flavored drinks  Coffee (sugar ok, no milk/cream) Gatorade  Juice: apple, white grape, white cranberry Lemonade   Soft Drinks(Coke etc)    White Grape Juice   You will need to have a responsible adult immediately available after your procedure to receive discharge instructions then drive you home. We strongly encourage your responsible adult to remain in the hospital during your procedure.  Your procedure will be canceled if a responsible adult is not available.                                                                                                                     Please leave ALL jewelry at home prior to coming to the hospital for your procedure.

## 2021-06-16 NOTE — Progress Notes (Addendum)
Pt denies any use of his prescribed medication in past 2 days.  Unable to swallow.  Has not taken any diabetes medications in 2 days.  FYI to Dr. Gala Romney.

## 2021-06-17 ENCOUNTER — Encounter (HOSPITAL_COMMUNITY): Payer: Self-pay | Admitting: Internal Medicine

## 2021-06-17 ENCOUNTER — Encounter (HOSPITAL_COMMUNITY)
Admission: RE | Disposition: A | Discharge status: Home / Self Care | Payer: Self-pay | Source: Home / Self Care | Attending: Internal Medicine

## 2021-06-17 ENCOUNTER — Ambulatory Visit (HOSPITAL_COMMUNITY)
Admission: RE | Admit: 2021-06-17 | Discharge: 2021-06-17 | Disposition: A | Discharge status: Home / Self Care | Payer: Medicare PPO | Attending: Internal Medicine | Admitting: Internal Medicine

## 2021-06-17 ENCOUNTER — Other Ambulatory Visit: Payer: Self-pay

## 2021-06-17 DIAGNOSIS — Z86010 Personal history of colonic polyps: Secondary | ICD-10-CM | POA: Diagnosis not present

## 2021-06-17 DIAGNOSIS — K449 Diaphragmatic hernia without obstruction or gangrene: Secondary | ICD-10-CM | POA: Diagnosis not present

## 2021-06-17 DIAGNOSIS — Z7951 Long term (current) use of inhaled steroids: Secondary | ICD-10-CM | POA: Insufficient documentation

## 2021-06-17 DIAGNOSIS — Z7982 Long term (current) use of aspirin: Secondary | ICD-10-CM | POA: Diagnosis not present

## 2021-06-17 DIAGNOSIS — Z800 Family history of malignant neoplasm of digestive organs: Secondary | ICD-10-CM | POA: Diagnosis not present

## 2021-06-17 DIAGNOSIS — Z803 Family history of malignant neoplasm of breast: Secondary | ICD-10-CM | POA: Diagnosis not present

## 2021-06-17 DIAGNOSIS — E785 Hyperlipidemia, unspecified: Secondary | ICD-10-CM | POA: Diagnosis not present

## 2021-06-17 DIAGNOSIS — K222 Esophageal obstruction: Secondary | ICD-10-CM | POA: Diagnosis not present

## 2021-06-17 DIAGNOSIS — K219 Gastro-esophageal reflux disease without esophagitis: Secondary | ICD-10-CM | POA: Diagnosis not present

## 2021-06-17 DIAGNOSIS — G4730 Sleep apnea, unspecified: Secondary | ICD-10-CM | POA: Insufficient documentation

## 2021-06-17 DIAGNOSIS — I10 Essential (primary) hypertension: Secondary | ICD-10-CM | POA: Insufficient documentation

## 2021-06-17 DIAGNOSIS — Z79899 Other long term (current) drug therapy: Secondary | ICD-10-CM | POA: Insufficient documentation

## 2021-06-17 DIAGNOSIS — Z880 Allergy status to penicillin: Secondary | ICD-10-CM | POA: Insufficient documentation

## 2021-06-17 DIAGNOSIS — K5750 Diverticulosis of both small and large intestine without perforation or abscess without bleeding: Secondary | ICD-10-CM | POA: Insufficient documentation

## 2021-06-17 DIAGNOSIS — Z881 Allergy status to other antibiotic agents status: Secondary | ICD-10-CM | POA: Diagnosis not present

## 2021-06-17 DIAGNOSIS — E119 Type 2 diabetes mellitus without complications: Secondary | ICD-10-CM | POA: Diagnosis not present

## 2021-06-17 DIAGNOSIS — R1310 Dysphagia, unspecified: Secondary | ICD-10-CM | POA: Diagnosis not present

## 2021-06-17 DIAGNOSIS — Z7984 Long term (current) use of oral hypoglycemic drugs: Secondary | ICD-10-CM | POA: Diagnosis not present

## 2021-06-17 HISTORY — PX: ESOPHAGOGASTRODUODENOSCOPY: SHX5428

## 2021-06-17 HISTORY — PX: MALONEY DILATION: SHX5535

## 2021-06-17 HISTORY — PX: BIOPSY: SHX5522

## 2021-06-17 HISTORY — DX: Essential (primary) hypertension: I10

## 2021-06-17 LAB — GLUCOSE, CAPILLARY: Glucose-Capillary: 129 mg/dL — ABNORMAL HIGH (ref 70–99)

## 2021-06-17 SURGERY — EGD (ESOPHAGOGASTRODUODENOSCOPY)
Anesthesia: Moderate Sedation

## 2021-06-17 MED ORDER — MEPERIDINE HCL 50 MG/ML IJ SOLN
INTRAMUSCULAR | Status: AC
  Filled 2021-06-17: qty 1

## 2021-06-17 MED ORDER — MEPERIDINE HCL 100 MG/ML IJ SOLN
INTRAMUSCULAR | Status: DC | PRN
  Administered 2021-06-17: 15 mg via INTRAVENOUS
  Administered 2021-06-17: 25 mg via INTRAVENOUS

## 2021-06-17 MED ORDER — MIDAZOLAM HCL 5 MG/5ML IJ SOLN
INTRAMUSCULAR | Status: DC | PRN
  Administered 2021-06-17: 2 mg via INTRAVENOUS
  Administered 2021-06-17: 1 mg via INTRAVENOUS

## 2021-06-17 MED ORDER — SODIUM CHLORIDE 0.9 % IV SOLN: INTRAVENOUS | Status: DC

## 2021-06-17 MED ORDER — LIDOCAINE VISCOUS HCL 2 % MT SOLN
OROMUCOSAL | Status: AC
  Filled 2021-06-17: qty 15

## 2021-06-17 MED ORDER — STERILE WATER FOR IRRIGATION IR SOLN
Status: DC | PRN
  Administered 2021-06-17: 100 mL

## 2021-06-17 MED ORDER — MIDAZOLAM HCL 5 MG/5ML IJ SOLN
INTRAMUSCULAR | Status: AC
  Filled 2021-06-17: qty 10

## 2021-06-17 MED ORDER — ONDANSETRON HCL 4 MG/2ML IJ SOLN
INTRAMUSCULAR | Status: AC
  Filled 2021-06-17: qty 2

## 2021-06-17 MED ORDER — LIDOCAINE VISCOUS HCL 2 % MT SOLN
OROMUCOSAL | Status: DC | PRN
  Administered 2021-06-17: 5 mL via OROMUCOSAL

## 2021-06-17 MED ORDER — ONDANSETRON HCL 4 MG/2ML IJ SOLN
INTRAMUSCULAR | Status: DC | PRN
  Administered 2021-06-17: 4 mg via INTRAVENOUS

## 2021-06-17 NOTE — H&P (Signed)
@LOGO @   Primary Care Physician:  Monico Blitz, MD Primary Gastroenterologist:  Dr. Gala Romney  Pre-Procedure History & Physical: HPI:  Edwin Burgess is a 75 y.o. male here for with recurrent esophageal dysphagia.  History of Schatzki's ring dilated back in 2011 and lastly in May 2021.  He came to the office yesterday and accompanied his wife to her appointment.  He noted to make he said recurrent esophageal dysphagia things have tightened up.  Can hardly get his pills down.  We arrange for him to urgently come to in the endoscopy unit today.  Reflux well controlled on Protonix 40 mg daily  He was dilated last year to 16 mm size with savory's.  I dilated him up to 69 [place] with Maloney's in 2011.  He did well for almost 10 years after dilation.  More recently, effects of dilation were relatively short-lived.  Asthma doing well.  Saw his PCP yesterday.  Past Medical History:  Diagnosis Date   Asthma    DM (diabetes mellitus) ([place])    GERD (gastroesophageal reflux disease)    Hyperlipidemia    Hypertension    Personal history of colonic polyps    Sleep apnea     Past Surgical History:  Procedure Laterality Date   BIOPSY  02/01/2020   Procedure: BIOPSY;  Surgeon: Danie Binder, MD;  [facility];  Service: Endoscopy;;   CATARACT EXTRACTION, BILATERAL     COLONOSCOPY  04/30/2006   dr.fleishman/mild diverticulosis. recommended to have next TCS 2012.   COLONOSCOPY  06/15/2012   IWL:[id number] internal hemorrhoids/ Mild diverticulosis/Eight sessile polyps measuring 2-6 mm (tubular adenomas). Surveillance in 5 years due to multiple adenomas   COLONOSCOPY N/A 08/26/2017   One 2 mm polyps in ascending colon, seven 3-6 mm polyps in sigmoid, transverse, and cecum, diverticulosis in rectosigmoid, descending, and cecum, external and internal hemorrhoids. (seven simple adenomas and one hyperplastic polyp). 3 year surveillance    COLONOSCOPY WITH PROPOFOL N/A 09/30/2020   Non-bleeding  internal hemorrhoids. Diverticulosis in the sigmoid colon and in the descending colon. One 8 mm polyp in the ascending colon, removed (tubular adenoma).. Two 3 to 4 mm polyps in the sigmoid colon, removed, Resected and retrieved (tubular adenoma). There was significant looping of the colon.   ESOPHAGOGASTRODUODENOSCOPY  07/06/2010   tight schatzki ring s/p dilation/small hiatal hernia   ESOPHAGOGASTRODUODENOSCOPY N/A 02/01/2020    one benign-appearing intrinsic mild stenosis s/p dilation, mild gastritis, large non-bleeding duodenal diverticulum. Negative H.pylori.    POLYPECTOMY  08/26/2017   Procedure: POLYPECTOMY;  Surgeon: Danie Binder, MD;  [facility];  Service: Endoscopy;;  cecal; transverse colon; ascending colon    POLYPECTOMY  09/30/2020   Procedure: POLYPECTOMY;  Surgeon: Eloise Harman, DO;  [facility];  Service: Endoscopy;;   SAVORY DILATION N/A 02/01/2020   Procedure: SAVORY DILATION;  Surgeon: Danie Binder, MD;  [facility];  Service: Endoscopy;  Laterality: N/A;    Prior to Admission medications   Medication Sig Start Date End Date Taking? Authorizing Provider  albuterol (PROVENTIL HFA;VENTOLIN HFA) 108 (90 BASE) MCG/ACT inhaler Inhale 2 puffs into the lungs as needed for wheezing or shortness of breath. Asthma   Yes [provider]  aspirin 81 MG tablet Take 81 mg by mouth daily.   Yes [provider]  Fluticasone-Salmeterol (ADVAIR) 250-50 MCG/DOSE AEPB Inhale 1 puff into the lungs at bedtime.   Yes [provider]  lisinopril (PRINIVIL,ZESTRIL) 2.5 MG tablet  Take 2.5 mg by mouth daily.   Yes [provider]  loratadine (CLARITIN) 10 MG tablet Take 10 mg by mouth daily.   Yes [provider]  metFORMIN (GLUCOPHAGE) 500 MG tablet Take 500 mg by mouth 2 (two) times daily with a meal. Takes 2 tablets twice daily.  (2000 mg daily) 10/13/11  Yes [provider]  montelukast (SINGULAIR)  10 MG tablet Take 10 mg by mouth at bedtime.   Yes [provider]  Multiple Vitamin (MULTIVITAMIN) capsule Take 1 capsule by mouth daily.   Yes [provider]  olopatadine (PATANOL) 0.1 % ophthalmic solution Place 1 drop into both eyes daily as needed for allergies.   Yes [provider]  pantoprazole (PROTONIX) 40 MG tablet Take 1 tablet (40 mg total) by mouth daily before breakfast. 08/08/20  Yes Annitta Needs, NP  rosuvastatin (CRESTOR) 10 MG tablet Take 10 mg by mouth every evening.    Yes [provider]  theophylline (THEODUR) 300 MG 12 hr tablet Take 300 mg by mouth 2 (two) times daily. 10/19/11  Yes [provider]    Allergies as of 06/16/2021 - Review Complete 06/16/2021  Allergen Reaction Noted   Amoxicillin Nausea And Vomiting 06/16/2021   Tetracycline Itching     Family History  Problem Relation Age of Onset   Colon cancer Maternal Grandfather 20   Breast cancer Maternal Aunt     Social History   Socioeconomic History   Marital status: Married    Spouse name: Not on file   Number of children: 2   Years of education: Not on file   Highest education level: Not on file  Occupational History   Occupation: Designer, television/film set   Occupation: retired Education officer, museum  Tobacco Use   Smoking status: Never   Smokeless tobacco: Never  Scientific laboratory technician Use: Never used  Substance and Sexual Activity   Alcohol use: No   Drug use: No   Sexual activity: Not on file  Other Topics Concern   Not on file  Social History Narrative   Not on file   Social Determinants of Health   Financial Resource Strain: Not on file  Food Insecurity: Not on file  Transportation Needs: Not on file  Physical Activity: Not on file  Stress: Not on file  Social Connections: Not on file  Intimate Partner Violence: Not on file    Review of Systems: See HPI, otherwise negative ROS  Physical Exam: BP 131/68   Pulse 71   Temp 97.9 F (36.6 C)  (Oral)   Resp 13   Ht 5\' 6"  (1.676 m)   Wt 81.6 kg   SpO2 99%   BMI 29.05 kg/m  General:   Alert,  Well-developed, well-nourished, pleasant and cooperative in NAD Mouth:  No deformity or lesions. Neck:  Supple; no masses or thyromegaly. No significant cervical adenopathy. Lungs:  Clear throughout to auscultation.   No wheezes, crackles, or rhonchi. No acute distress. Heart:  Regular rate and rhythm; no murmurs, clicks, rubs,  or gallops. Abdomen: Non-distended, normal bowel sounds.  Soft and nontender without appreciable mass or hepatosplenomegaly.  Pulses:  Normal pulses noted. Extremities:  Without clubbing or edema.  Impression/Plan: 75 year old gentleman with a longstanding GERD recurrent esophageal dysphagia in setting of known Schatzki's ring.  Patient is here for reassessment/retreatment.  I will offer the patient an EGD with esophageal dilation as feasible/appropriate.  Questions were answered.  He is agreeable.  ASA 3.  Conscious sedation.  Further recommendations to follow.     Notice: This dictation was prepared with Dragon dictation along with smaller phrase technology. Any transcriptional errors that result from this process are unintentional and may not be corrected upon review.

## 2021-06-17 NOTE — Op Note (Signed)
[facility] Patient Name: Edwin Burgess Procedure Date: 06/17/2021 10:17 AM MRN: [id number] Date of Birth: 03-07-1946 Attending MD: Norvel Richards , MD CSN: [id number] Age: 75 Admit Type: Outpatient Procedure:                Upper GI endoscopy Indications:              Dysphagia Providers:                Norvel Richards, MD, Caprice Kluver, Randa Spike, Technician Referring MD:              Medicines:                Midazolam 3 mg IV, Meperidine 40 mg IV Complications:            No immediate complications. Estimated Blood Loss:     Estimated blood loss was minimal. Procedure:                Pre-Anesthesia Assessment:                           - Prior to the procedure, a History and Physical                            was performed, and patient medications and                            allergies were reviewed. The patient's tolerance of                            previous anesthesia was also reviewed. The risks                            and benefits of the procedure and the sedation                            options and risks were discussed with the patient.                            All questions were answered, and informed consent                            was obtained. Prior Anticoagulants: The patient has                            taken no previous anticoagulant or antiplatelet                            agents. ASA Grade Assessment: III - A patient with                            severe systemic disease. After reviewing the risks  and benefits, the patient was deemed in                            satisfactory condition to undergo the procedure.                           After obtaining informed consent, the endoscope was                            passed under direct vision. Throughout the                            procedure, the patient's blood pressure, pulse, and                            oxygen  saturations were monitored continuously. The                            GIF-H190 ([id number]) scope was introduced through the                            mouth, and advanced to the second part of duodenum.                            The upper GI endoscopy was accomplished without                            difficulty. The patient tolerated the procedure                            well. Scope In: 11:03:04 AM Scope Out: 11:10:31 AM Total Procedure Duration: 0 hours 7 minutes 27 seconds  Findings:      Peptic appearing stricture with overlying erosions at the GE junction.       It was short. Benign-appearing. No associated Barrett's epithelium seen.       The tubular esophagus down to the EG junction appeared widely patent. EG       junction traversed with the scope with minimal resistance. Stomach       empty. Gastric antral and body erosions found small hiatal hernia on       retroflexion. Pylorus patent.      Scope was withdrawn. A 52 French Maloney dilator was passed to moderate       resistance. A look back revealed moderately good dilation of the       stricture without apparent complication. Minimal bleeding. Finally,       biopsies of the abnormal gastric mucosa were taken. Impression:               - Short peptic esophageal stricture - status post                            dilation as described above. Small hiatal hernia.                            Gastric erosions?"status post biopsy. Multiple  bulbar ulcers and erosions as described above.                            Duodenal diverticulum.. Moderate Sedation:      Moderate (conscious) sedation was administered by the endoscopy nurse       and supervised by the endoscopist. The following parameters were       monitored: oxygen saturation, heart rate, blood pressure, respiratory       rate, EKG, adequacy of pulmonary ventilation, and response to care.       Total physician intraservice time was 15  minutes. Recommendation:           - Patient has a contact number available for                            emergencies. The signs and symptoms of potential                            delayed complications were discussed with the                            patient. Return to normal activities tomorrow.                            Written discharge instructions were provided to the                            patient.                           - Advance diet as tolerated. Increase Protonix to                            40 mg twice daily. Avoid all NSAIDs. Follow-up on                            pathology. Office visit with [place] in 3 months Procedure Code(s):        --- Professional ---                           [phone], Esophagogastroduodenoscopy, flexible,                            transoral; diagnostic, including collection of                            specimen(s) by brushing or washing, when performed                            (separate procedure)                           G0500, Moderate sedation services provided by the                            same physician or other qualified health care  professional performing a gastrointestinal                            endoscopic service that sedation supports,                            requiring the presence of an independent trained                            observer to assist in the monitoring of the                            patient's level of consciousness and physiological                            status; initial 15 minutes of intra-service time;                            patient age 33 years or older (additional time may                            be reported with [phone], as appropriate) Diagnosis Code(s):        --- Professional ---                           R13.10, Dysphagia, unspecified CPT copyright 2019 [employer]. All rights reserved. The codes documented in this report are preliminary  and upon coder review may  be revised to meet current compliance requirements. Cristopher Estimable. Maisie Hauser, MD Norvel Richards, MD 06/17/2021 11:33:44 AM This report has been signed electronically. Number of Addenda: 0

## 2021-06-17 NOTE — Discharge Instructions (Addendum)
EGD Discharge instructions Please read the instructions outlined below and refer to this sheet in the next few weeks. These discharge instructions provide you with general information on caring for yourself after you leave the hospital. Your doctor may also give you specific instructions. While your treatment has been planned according to the most current medical practices available, unavoidable complications occasionally occur. If you have any problems or questions after discharge, please call your doctor. ACTIVITY You may resume your regular activity but move at a slower pace for the next 24 hours.  Take frequent rest periods for the next 24 hours.  Walking will help expel (get rid of) the air and reduce the bloated feeling in your abdomen.  No driving for 24 hours (because of the anesthesia (medicine) used during the test).  You may shower.  Do not sign any important legal documents or operate any machinery for 24 hours (because of the anesthesia used during the test).  NUTRITION Drink plenty of fluids.  You may resume your normal diet.  Begin with a light meal and progress to your normal diet.  Avoid alcoholic beverages for 24 hours or as instructed by your caregiver.  MEDICATIONS You may resume your normal medications unless your caregiver tells you otherwise.  WHAT YOU CAN EXPECT TODAY You may experience abdominal discomfort such as a feeling of fullness or "gas" pains.  FOLLOW-UP Your doctor will discuss the results of your test with you.  SEEK IMMEDIATE MEDICAL ATTENTION IF ANY OF THE FOLLOWING OCCUR: Excessive nausea (feeling sick to your stomach) and/or vomiting.  Severe abdominal pain and distention (swelling).  Trouble swallowing.  Temperature over 101 F (37.8 C).  Rectal bleeding or vomiting of blood.    You have an acid reflux related stricture in your esophagus.  It was dilated today.  You also had inflammation in your stomach and 2 ulcers in your duodenum.  Biopsies  of your stomach were done to check for infection  Increase Protonix to 40 mg twice daily (before breakfast and supper)  Office visit with [place] in 3 months  Would avoid all NSAIDs like ibuprofen Aleve and aspirin products.  1 baby aspirin daily is okay.  Further recommendations to follow pending review of pathology report  At patient request I called Hazle Quant her at [phone]-reviewed findings and recommendations

## 2021-06-19 ENCOUNTER — Encounter: Payer: Self-pay | Admitting: Internal Medicine

## 2021-06-19 LAB — SURGICAL PATHOLOGY

## 2021-06-23 ENCOUNTER — Other Ambulatory Visit: Payer: Self-pay | Admitting: Gastroenterology

## 2021-06-23 ENCOUNTER — Encounter (HOSPITAL_COMMUNITY): Payer: Self-pay | Admitting: Internal Medicine

## 2021-06-23 ENCOUNTER — Telehealth: Payer: Self-pay | Admitting: Internal Medicine

## 2021-06-23 ENCOUNTER — Other Ambulatory Visit (HOSPITAL_COMMUNITY)
Admission: RE | Admit: 2021-06-23 | Discharge: 2021-06-23 | Disposition: A | Discharge status: Home / Self Care | Payer: Medicare PPO | Source: Ambulatory Visit | Attending: Internal Medicine | Admitting: Internal Medicine

## 2021-06-23 ENCOUNTER — Other Ambulatory Visit: Payer: Self-pay

## 2021-06-23 DIAGNOSIS — R5383 Other fatigue: Secondary | ICD-10-CM | POA: Insufficient documentation

## 2021-06-23 DIAGNOSIS — R112 Nausea with vomiting, unspecified: Secondary | ICD-10-CM

## 2021-06-23 LAB — CBC WITH DIFFERENTIAL/PLATELET
Abs Immature Granulocytes: 0.03 10*3/uL (ref 0.00–0.07)
Basophils Absolute: 0 10*3/uL (ref 0.0–0.1)
Basophils Relative: 1 %
Eosinophils Absolute: 0.1 10*3/uL (ref 0.0–0.5)
Eosinophils Relative: 2 %
HCT: 37.8 % — ABNORMAL LOW (ref 39.0–52.0)
Hemoglobin: 13.5 g/dL (ref 13.0–17.0)
Immature Granulocytes: 0 %
Lymphocytes Relative: 12 %
Lymphs Abs: 1 10*3/uL (ref 0.7–4.0)
MCH: 32.5 pg (ref 26.0–34.0)
MCHC: 35.7 g/dL (ref 30.0–36.0)
MCV: 91.1 fL (ref 80.0–100.0)
Monocytes Absolute: 0.8 10*3/uL (ref 0.1–1.0)
Monocytes Relative: 9 %
Neutro Abs: 6.2 10*3/uL (ref 1.7–7.7)
Neutrophils Relative %: 76 %
Platelets: 259 10*3/uL (ref 150–400)
RBC: 4.15 MIL/uL — ABNORMAL LOW (ref 4.22–5.81)
RDW: 12.3 % (ref 11.5–15.5)
WBC: 8.1 10*3/uL (ref 4.0–10.5)
nRBC: 0 % (ref 0.0–0.2)

## 2021-06-23 LAB — COMPREHENSIVE METABOLIC PANEL
ALT: 16 U/L (ref 0–44)
AST: 19 U/L (ref 15–41)
Albumin: 4.2 g/dL (ref 3.5–5.0)
Alkaline Phosphatase: 66 U/L (ref 38–126)
Anion gap: 11 (ref 5–15)
BUN: 45 mg/dL — ABNORMAL HIGH (ref 8–23)
CO2: 23 mmol/L (ref 22–32)
Calcium: 10.2 mg/dL (ref 8.9–10.3)
Chloride: 99 mmol/L (ref 98–111)
Creatinine, Ser: 2.66 mg/dL — ABNORMAL HIGH (ref 0.61–1.24)
GFR, Estimated: 24 mL/min — ABNORMAL LOW (ref >60)
Glucose, Bld: 162 mg/dL — ABNORMAL HIGH (ref 70–99)
Potassium: 3.6 mmol/L (ref 3.5–5.1)
Sodium: 133 mmol/L — ABNORMAL LOW (ref 135–145)
Total Bilirubin: 0.9 mg/dL (ref 0.3–1.2)
Total Protein: 7.7 g/dL (ref 6.5–8.1)

## 2021-06-23 LAB — TSH: TSH: 1.677 u[IU]/mL (ref 0.350–4.500)

## 2021-06-23 NOTE — Telephone Encounter (Signed)
Pt called asking for RMR nurse. I told him she wasn't available at the moment and transferred to VM.

## 2021-06-23 NOTE — Telephone Encounter (Signed)
Pt called stating that he had his procedure with Dr. Gala Romney last week and is able to swallow good now. Pt states that he still has no appetite, is nauseous all the time, as well as constantly feeling tired. Pt states that at times he will gag when he takes pills and vomit. Pt is wanting to know what his next steps should be. Please advise. Thanks.

## 2021-06-23 NOTE — Telephone Encounter (Signed)
Pt is agreeable with labs and would like to discuss the gastric emptying study with app in office. Pt states that 5 to 6 weeks ago he was experiencing allergy like symptoms along with a cough and the last couple weeks he started having the nausea, vomiting and states that he has lost about 10 lbs. Ordered labs for pt to have done at [facility] and will also have copy the front to put the pt on the schedule with the first available app.

## 2021-06-23 NOTE — Telephone Encounter (Signed)
When we saw him in June, he wasn't have these issues. When did the loss of appetite and nausea start?   We can check CBC, CMP, TSH. Could pursue gastric emptying study if agrees. Needs office visit whichever APP is available.

## 2021-06-24 ENCOUNTER — Other Ambulatory Visit: Payer: Self-pay

## 2021-06-24 ENCOUNTER — Observation Stay (HOSPITAL_COMMUNITY)
Admission: EM | Admit: 2021-06-24 | Discharge: 2021-06-25 | Disposition: A | Discharge status: Home / Self Care | Payer: Medicare PPO | Attending: Nurse Practitioner | Admitting: Nurse Practitioner

## 2021-06-24 ENCOUNTER — Encounter (HOSPITAL_COMMUNITY): Payer: Self-pay | Admitting: Emergency Medicine

## 2021-06-24 ENCOUNTER — Observation Stay (HOSPITAL_COMMUNITY): Payer: Medicare PPO

## 2021-06-24 DIAGNOSIS — R110 Nausea: Secondary | ICD-10-CM | POA: Diagnosis present

## 2021-06-24 DIAGNOSIS — E860 Dehydration: Secondary | ICD-10-CM | POA: Diagnosis not present

## 2021-06-24 DIAGNOSIS — I129 Hypertensive chronic kidney disease with stage 1 through stage 4 chronic kidney disease, or unspecified chronic kidney disease: Secondary | ICD-10-CM | POA: Insufficient documentation

## 2021-06-24 DIAGNOSIS — Z79899 Other long term (current) drug therapy: Secondary | ICD-10-CM | POA: Insufficient documentation

## 2021-06-24 DIAGNOSIS — Z20822 Contact with and (suspected) exposure to covid-19: Secondary | ICD-10-CM | POA: Insufficient documentation

## 2021-06-24 DIAGNOSIS — Z7982 Long term (current) use of aspirin: Secondary | ICD-10-CM | POA: Insufficient documentation

## 2021-06-24 DIAGNOSIS — Z7984 Long term (current) use of oral hypoglycemic drugs: Secondary | ICD-10-CM | POA: Diagnosis not present

## 2021-06-24 DIAGNOSIS — I10 Essential (primary) hypertension: Secondary | ICD-10-CM

## 2021-06-24 DIAGNOSIS — E1122 Type 2 diabetes mellitus with diabetic chronic kidney disease: Secondary | ICD-10-CM | POA: Diagnosis not present

## 2021-06-24 DIAGNOSIS — N1832 Chronic kidney disease, stage 3b: Secondary | ICD-10-CM | POA: Diagnosis not present

## 2021-06-24 DIAGNOSIS — N200 Calculus of kidney: Secondary | ICD-10-CM | POA: Diagnosis not present

## 2021-06-24 DIAGNOSIS — J45909 Unspecified asthma, uncomplicated: Secondary | ICD-10-CM | POA: Diagnosis not present

## 2021-06-24 DIAGNOSIS — E1121 Type 2 diabetes mellitus with diabetic nephropathy: Secondary | ICD-10-CM

## 2021-06-24 DIAGNOSIS — K2100 Gastro-esophageal reflux disease with esophagitis, without bleeding: Secondary | ICD-10-CM

## 2021-06-24 DIAGNOSIS — N179 Acute kidney failure, unspecified: Secondary | ICD-10-CM | POA: Diagnosis not present

## 2021-06-24 LAB — LIPASE, BLOOD: Lipase: 44 U/L (ref 11–51)

## 2021-06-24 LAB — BRAIN NATRIURETIC PEPTIDE: B Natriuretic Peptide: 30 pg/mL (ref 0.0–100.0)

## 2021-06-24 LAB — BASIC METABOLIC PANEL
Anion gap: 9 (ref 5–15)
BUN: 41 mg/dL — ABNORMAL HIGH (ref 8–23)
CO2: 24 mmol/L (ref 22–32)
Calcium: 10.3 mg/dL (ref 8.9–10.3)
Chloride: 98 mmol/L (ref 98–111)
Creatinine, Ser: 2.52 mg/dL — ABNORMAL HIGH (ref 0.61–1.24)
GFR, Estimated: 26 mL/min — ABNORMAL LOW (ref >60)
Glucose, Bld: 119 mg/dL — ABNORMAL HIGH (ref 70–99)
Potassium: 4.1 mmol/L (ref 3.5–5.1)
Sodium: 131 mmol/L — ABNORMAL LOW (ref 135–145)

## 2021-06-24 LAB — GLUCOSE, CAPILLARY
Glucose-Capillary: 80 mg/dL (ref 70–99)
Glucose-Capillary: 98 mg/dL (ref 70–99)

## 2021-06-24 MED ORDER — LACTATED RINGERS IV BOLUS
1000.0000 mL | Freq: Once | INTRAVENOUS | Status: AC
  Administered 2021-06-24: 1000 mL via INTRAVENOUS

## 2021-06-24 MED ORDER — ONDANSETRON HCL 4 MG/2ML IJ SOLN: 4.0000 mg | Freq: Four times a day (QID) | INTRAMUSCULAR | Status: DC | PRN

## 2021-06-24 MED ORDER — PANTOPRAZOLE SODIUM 40 MG PO TBEC
40.0000 mg | DELAYED_RELEASE_TABLET | Freq: Every day | ORAL | Status: DC
  Administered 2021-06-25: 40 mg via ORAL
  Filled 2021-06-24: qty 1

## 2021-06-24 MED ORDER — ACETAMINOPHEN 650 MG RE SUPP: 650.0000 mg | Freq: Four times a day (QID) | RECTAL | Status: DC | PRN

## 2021-06-24 MED ORDER — ADULT MULTIVITAMIN W/MINERALS CH
1.0000 | ORAL_TABLET | Freq: Every day | ORAL | Status: DC
  Administered 2021-06-25: 1 via ORAL
  Filled 2021-06-24: qty 1

## 2021-06-24 MED ORDER — ACETAMINOPHEN 325 MG PO TABS: 650.0000 mg | ORAL_TABLET | Freq: Four times a day (QID) | ORAL | Status: DC | PRN

## 2021-06-24 MED ORDER — LACTATED RINGERS IV SOLN: INTRAVENOUS | Status: DC

## 2021-06-24 MED ORDER — SODIUM CHLORIDE 0.9 % IV BOLUS
500.0000 mL | Freq: Once | INTRAVENOUS | Status: AC
  Administered 2021-06-24: 500 mL via INTRAVENOUS

## 2021-06-24 MED ORDER — INSULIN ASPART 100 UNIT/ML IJ SOLN: 0.0000 [IU] | Freq: Three times a day (TID) | INTRAMUSCULAR | Status: DC

## 2021-06-24 MED ORDER — POLYETHYLENE GLYCOL 3350 17 G PO PACK: 17.0000 g | PACK | Freq: Every day | ORAL | Status: DC | PRN

## 2021-06-24 MED ORDER — ENSURE ENLIVE PO LIQD
237.0000 mL | Freq: Two times a day (BID) | ORAL | Status: DC
  Administered 2021-06-25: 237 mL via ORAL

## 2021-06-24 MED ORDER — ONDANSETRON HCL 4 MG PO TABS: 4.0000 mg | ORAL_TABLET | Freq: Four times a day (QID) | ORAL | Status: DC | PRN

## 2021-06-24 MED ORDER — THEOPHYLLINE ER 300 MG PO TB12
300.0000 mg | ORAL_TABLET | Freq: Two times a day (BID) | ORAL | Status: DC
  Administered 2021-06-24 – 2021-06-25 (×2): 300 mg via ORAL
  Filled 2021-06-24 (×4): qty 1

## 2021-06-24 MED ORDER — TRAZODONE HCL 50 MG PO TABS: 25.0000 mg | ORAL_TABLET | Freq: Every evening | ORAL | Status: DC | PRN

## 2021-06-24 MED ORDER — ROSUVASTATIN CALCIUM 10 MG PO TABS
10.0000 mg | ORAL_TABLET | Freq: Every evening | ORAL | Status: DC
  Administered 2021-06-24: 10 mg via ORAL
  Filled 2021-06-24: qty 1

## 2021-06-24 MED ORDER — SODIUM CHLORIDE 0.9% FLUSH
3.0000 mL | Freq: Two times a day (BID) | INTRAVENOUS | Status: DC
  Administered 2021-06-24 – 2021-06-25 (×2): 3 mL via INTRAVENOUS

## 2021-06-24 NOTE — ED Provider Notes (Signed)
[facility] EMERGENCY DEPARTMENT Provider Note   CSN: [id number] Arrival date & time: 06/24/21  1306     History Chief Complaint  Patient presents with   abnormal labs    Edwin Burgess is a 75 y.o. male.  Patient reports general decline in activity level with loss of appetite over the last 5-6 weeks. Persistent nausea with occasional emesis. No diarrhea. Denies dark or tarry appearing stool. LUQ abdominal discomfort without tenderness. Patient had lab work completed yesterday per request of his gastroenterologist Sydell Axon). Contacted today and notified of elevated creatinine and BUN, sent to ED.  The history is provided by the patient and medical records.  Weakness Severity:  Moderate Onset quality:  Gradual Duration:  6 weeks Timing:  Intermittent Progression:  Worsening Chronicity:  New Associated symptoms: nausea and vomiting   Associated symptoms: no chest pain, no dysuria, no fever, no frequency, no loss of consciousness, no melena, no near-syncope, no shortness of breath and no syncope       Past Medical History:  Diagnosis Date   Asthma    DM (diabetes mellitus) (HCC)    GERD (gastroesophageal reflux disease)    Hyperlipidemia    Hypertension    Personal history of colonic polyps    Sleep apnea     Patient Active Problem List   Diagnosis Date Noted   Gastritis without bleeding    Dysphagia, idiopathic 12/05/2019   Hx of adenomatous colonic polyps 05/15/2012   Gastroesophageal reflux disease without esophagitis 06/18/2010    Past Surgical History:  Procedure Laterality Date   BIOPSY  02/01/2020   Procedure: BIOPSY;  Surgeon: Danie Binder, MD;  [facility]: [facility];  Service: Endoscopy;;   BIOPSY  06/17/2021   Procedure: BIOPSY;  Surgeon: Daneil Dolin, MD;  Location: [facility];  Service: Endoscopy;;   CATARACT EXTRACTION, BILATERAL     COLONOSCOPY  04/30/2006   dr.fleishman/mild diverticulosis. recommended to have next TCS 2012.    COLONOSCOPY  06/15/2012   JOA:CZYSA internal hemorrhoids/ Mild diverticulosis/Eight sessile polyps measuring 2-6 mm (tubular adenomas). Surveillance in 5 years due to multiple adenomas   COLONOSCOPY N/A 08/26/2017   One 2 mm polyps in ascending colon, seven 3-6 mm polyps in sigmoid, transverse, and cecum, diverticulosis in rectosigmoid, descending, and cecum, external and internal hemorrhoids. (seven simple adenomas and one hyperplastic polyp). 3 year surveillance    COLONOSCOPY WITH PROPOFOL N/A 09/30/2020   Non-bleeding internal hemorrhoids. Diverticulosis in the sigmoid colon and in the descending colon. One 8 mm polyp in the ascending colon, removed (tubular adenoma).. Two 3 to 4 mm polyps in the sigmoid colon, removed, Resected and retrieved (tubular adenoma). There was significant looping of the colon.   ESOPHAGOGASTRODUODENOSCOPY  07/06/2010   tight schatzki ring s/p dilation/small hiatal hernia   ESOPHAGOGASTRODUODENOSCOPY N/A 02/01/2020    one benign-appearing intrinsic mild stenosis s/p dilation, mild gastritis, large non-bleeding duodenal diverticulum. Negative H.pylori.    ESOPHAGOGASTRODUODENOSCOPY N/A 06/17/2021   Procedure: ESOPHAGOGASTRODUODENOSCOPY (EGD);  Surgeon: Daneil Dolin, MD;  Location: [facility];  Service: Endoscopy;  Laterality: N/A;  10:30   MALONEY DILATION N/A 06/17/2021   Procedure: Venia Minks DILATION;  Surgeon: Daneil Dolin, MD;  Location: [facility];  Service: Endoscopy;  Laterality: N/A;  EGD with [facility] Dilation   POLYPECTOMY  08/26/2017   Procedure: POLYPECTOMY;  Surgeon: Danie Binder, MD;  [facility]: [facility];  Service: Endoscopy;;  cecal; transverse colon; ascending colon    POLYPECTOMY  09/30/2020  Procedure: POLYPECTOMY;  Surgeon: Eloise Harman, DO;  [facility]: [facility];  Service: Endoscopy;;   SAVORY DILATION N/A 02/01/2020   Procedure: SAVORY DILATION;  Surgeon: Danie Binder, MD;  [facility]: [facility];  Service: Endoscopy;   Laterality: N/A;       Family History  Problem Relation Age of Onset   Colon cancer Maternal Grandfather 91   Breast cancer Maternal Aunt     Social History   Tobacco Use   Smoking status: Never   Smokeless tobacco: Never  Vaping Use   Vaping Use: Never used  Substance Use Topics   Alcohol use: No   Drug use: No    Home Medications Prior to Admission medications   Medication Sig Start Date End Date Taking? Authorizing Provider  albuterol (PROVENTIL HFA;VENTOLIN HFA) 108 (90 BASE) MCG/ACT inhaler Inhale 2 puffs into the lungs as needed for wheezing or shortness of breath. Asthma    [provider]  aspirin 81 MG tablet Take 81 mg by mouth daily.    [provider]  Fluticasone-Salmeterol (ADVAIR) 250-50 MCG/DOSE AEPB Inhale 1 puff into the lungs at bedtime.    [provider]  lisinopril (PRINIVIL,ZESTRIL) 2.5 MG tablet Take 2.5 mg by mouth daily.    [provider]  loratadine (CLARITIN) 10 MG tablet Take 10 mg by mouth daily.    [provider]  metFORMIN (GLUCOPHAGE) 500 MG tablet Take 500 mg by mouth 2 (two) times daily with a meal. Takes 2 tablets twice daily.  (2000 mg daily) 10/13/11   [provider]  montelukast (SINGULAIR) 10 MG tablet Take 10 mg by mouth at bedtime.    [provider]  Multiple Vitamin (MULTIVITAMIN) capsule Take 1 capsule by mouth daily.    [provider]  olopatadine (PATANOL) 0.1 % ophthalmic solution Place 1 drop into both eyes daily as needed for allergies.    [provider]  pantoprazole (PROTONIX) 40 MG tablet Take 1 tablet (40 mg total) by mouth daily before breakfast. 08/08/20   Annitta Needs, NP  rosuvastatin (CRESTOR) 10 MG tablet Take 10 mg by mouth every evening.     [provider]  theophylline (THEODUR) 300 MG 12 hr tablet Take 300 mg by mouth 2 (two) times daily. 10/19/11   [provider]    Allergies    Amoxicillin and  Tetracycline  Review of Systems   Review of Systems  Constitutional:  Positive for fatigue. Negative for fever.  Respiratory:  Negative for shortness of breath.   Cardiovascular:  Negative for chest pain, syncope and near-syncope.  Gastrointestinal:  Positive for nausea and vomiting. Negative for melena.  Genitourinary:  Negative for dysuria and frequency.  Neurological:  Positive for weakness. Negative for loss of consciousness.  All other systems reviewed and are negative.  Physical Exam Updated Vital Signs BP 138/60 (BP Location: Right Arm)   Pulse 87   Temp 97.9 F (36.6 C)   Resp 18   Ht 5\' 6"  (1.676 m)   Wt 81.6 kg   SpO2 100%   BMI 29.05 kg/m   Physical Exam Constitutional:      Appearance: Normal appearance.  HENT:     Head: Normocephalic.     Nose: Nose normal.     Mouth/Throat:     Mouth: Mucous membranes are moist.  Eyes:     Conjunctiva/sclera: Conjunctivae normal.  Cardiovascular:     Rate and Rhythm: Normal rate and regular rhythm.  Pulmonary:  Effort: Pulmonary effort is normal.     Breath sounds: Normal breath sounds.  Musculoskeletal:        General: Normal range of motion.  Skin:    General: Skin is warm and dry.  Neurological:     Mental Status: He is alert and oriented to person, place, and time.  Psychiatric:        Mood and Affect: Mood normal.        Behavior: Behavior normal.    ED Results / Procedures / Treatments   Labs (all labs ordered are listed, but only abnormal results are displayed) Labs Reviewed  BASIC METABOLIC PANEL - Abnormal; Notable for the following components:      Result Value   Sodium 131 (*)    Glucose, Bld 119 (*)    BUN 41 (*)    Creatinine, Ser 2.52 (*)    GFR, Estimated 26 (*)    All other components within normal limits  LIPASE, BLOOD    EKG None  Radiology No results found.  Procedures Procedures   Medications Ordered in ED Medications  sodium chloride 0.9 % bolus 500 mL (0 mLs  Intravenous Stopped 06/24/21 1603)    ED Course  I have reviewed the triage vital signs and the nursing notes.  Pertinent labs & imaging results that were available during my care of the patient were reviewed by me and considered in my medical decision making (see chart for details).  Labs obtained on 06/23/21 reviewed. Normal hemoglobin, decreased hematocrit (37.8) .Notable for renal insufficiency. BMET will be rechecked today. IV fluids initiated.  Results for orders placed or performed during the hospital encounter of 06/23/21  CBC with Differential/Platelet  Result Value Ref Range   WBC 8.1 4.0 - 10.5 K/uL   RBC 4.15 (L) 4.22 - 5.81 MIL/uL   Hemoglobin 13.5 13.0 - 17.0 g/dL   HCT 37.8 (L) 39.0 - 52.0 %   MCV 91.1 80.0 - 100.0 fL   MCH 32.5 26.0 - 34.0 pg   MCHC 35.7 30.0 - 36.0 g/dL   RDW 12.3 11.5 - 15.5 %   Platelets 259 150 - 400 K/uL   nRBC 0.0 0.0 - 0.2 %   Neutrophils Relative % 76 %   Neutro Abs 6.2 1.7 - 7.7 K/uL   Lymphocytes Relative 12 %   Lymphs Abs 1.0 0.7 - 4.0 K/uL   Monocytes Relative 9 %   Monocytes Absolute 0.8 0.1 - 1.0 K/uL   Eosinophils Relative 2 %   Eosinophils Absolute 0.1 0.0 - 0.5 K/uL   Basophils Relative 1 %   Basophils Absolute 0.0 0.0 - 0.1 K/uL   Immature Granulocytes 0 %   Abs Immature Granulocytes 0.03 0.00 - 0.07 K/uL  Comprehensive metabolic panel  Result Value Ref Range   Sodium 133 (L) 135 - 145 mmol/L   Potassium 3.6 3.5 - 5.1 mmol/L   Chloride 99 98 - 111 mmol/L   CO2 23 22 - 32 mmol/L   Glucose, Bld 162 (H) 70 - 99 mg/dL   BUN 45 (H) 8 - 23 mg/dL   Creatinine, Ser 2.66 (H) 0.61 - 1.24 mg/dL   Calcium 10.2 8.9 - 10.3 mg/dL   Total Protein 7.7 6.5 - 8.1 g/dL   Albumin 4.2 3.5 - 5.0 g/dL   AST 19 15 - 41 U/L   ALT 16 0 - 44 U/L   Alkaline Phosphatase 66 38 - 126 U/L   Total Bilirubin 0.9 0.3 - 1.2 mg/dL  GFR, Estimated 24 (L) >60 mL/min   Anion gap 11 5 - 15  TSH  Result Value Ref Range   TSH 1.677 0.350 - 4.500 uIU/mL    No results found.    MDM Rules/Calculators/A&P                           Patient with AKI. Fluid resuscitation initiated in ED. Will request admission.  Discussed patient with Dr. Dione Plover, who accepts patient for admission.  Patient presents with generalized weakness and fatigue. Labs indicate AKI. No prior labs to compare. Considered alternative etiologies including infectious processes, severe metabolic derangements, ischemia/ACS, heart failure, and intracranial/central processes, but think these are unlikely given history and physical exam. Final Clinical Impression(s) / ED Diagnoses Final diagnoses:  AKI (acute kidney injury) [facility])    Rx / [facility] Orders ED Discharge Orders     None        Etta Quill, NP 06/24/21 1617    Hayden Rasmussen, MD 06/24/21 1801

## 2021-06-24 NOTE — H&P (Signed)
[facility] History and Physical  Edwin Burgess HYQ:[id number] DOB: [birth year] DOA: 06/24/2021  Referring physician: Etta Quill, NP PCP: Monico Blitz, MD   Chief Complaint: abnormal labs  HPI: Edwin Burgess is a 75 y.o. male with history of well-controlled type 2 diabetes, asthma, GERD complicated by esophageal strictures, colonic polyps, who presents to the [facility] emergency room at the instruction of his gastroenterologist after routine labs found significant renal abnormalities.  Patient recently underwent EGD on September 21 (about a week ago) and was found to have an esophageal stricture which was dilated, as well as some gastric and duodenal ulcers with benign pathology results.  He subsequently reported to his gastroenterologist that he continued to have feelings of weakness and nausea vomiting, and was sent for CBC, CMP, TSH.  CBC and TSH were unremarkable, however CMP showed creatinine of 2.6 and he was instructed to go to the ED for further evaluation.  On my history patient reports about 5 weeks of just not feeling himself.  He is normally very active but is felt tired, weak, and frequently nauseous.  He reports since his EGD last week his swallowing has improved significantly, he also reports that he has had very low p.o. intake over the past few weeks because of his swallowing issues.  He does not recall ever being told that he has any problem with his kidneys, he goes to Dr. Manuella Ghazi at [facility] for his primary care.  He denies any abdominal pain, back pain, blood in his urine.  He denies regular use of any NSAIDs or other over-the-counter medicines.  He reports he is taking lisinopril 2.5 mg for some years at the direction of his PCP for renal protection.  He has also taken a PPI for many years.  He denies any history of smoking or exposure to chemical dyes.  In the ED vital signs unremarkable.  Repeat BMP shows creatinine of 2.5, very mild hyponatremia at 131.   He was given a 500 cc bolus and admitted for further work-up of his abnormal renal function.  Review of Systems:  Pertinent positives and negative per HPI, all others reviewed and negative  Past Medical History:  Diagnosis Date   Asthma    DM (diabetes mellitus) ([place])    GERD (gastroesophageal reflux disease)    Hyperlipidemia    Hypertension    Personal history of colonic polyps    Sleep apnea    Past Surgical History:  Procedure Laterality Date   BIOPSY  02/01/2020   Procedure: BIOPSY;  Surgeon: Danie Binder, MD;  [facility];  Service: Endoscopy;;   BIOPSY  06/17/2021   Procedure: BIOPSY;  Surgeon: Daneil Dolin, MD;  [facility];  Service: Endoscopy;;   CATARACT EXTRACTION, BILATERAL     COLONOSCOPY  04/30/2006   dr.fleishman/mild diverticulosis. recommended to have next TCS 2012.   COLONOSCOPY  06/15/2012   XBM:WUXLK internal hemorrhoids/ Mild diverticulosis/Eight sessile polyps measuring 2-6 mm (tubular adenomas). Surveillance in 5 years due to multiple adenomas   COLONOSCOPY N/A 08/26/2017   One 2 mm polyps in ascending colon, seven 3-6 mm polyps in sigmoid, transverse, and cecum, diverticulosis in rectosigmoid, descending, and cecum, external and internal hemorrhoids. (seven simple adenomas and one hyperplastic polyp). 3 year surveillance    COLONOSCOPY WITH PROPOFOL N/A 09/30/2020   Non-bleeding internal hemorrhoids. Diverticulosis in the sigmoid colon and in the descending colon. One 8 mm polyp in the ascending colon, removed (tubular adenoma).. Two  3 to 4 mm polyps in the sigmoid colon, removed, Resected and retrieved (tubular adenoma). There was significant looping of the colon.   ESOPHAGOGASTRODUODENOSCOPY  07/06/2010   tight schatzki ring s/p dilation/small hiatal hernia   ESOPHAGOGASTRODUODENOSCOPY N/A 02/01/2020    one benign-appearing intrinsic mild stenosis s/p dilation, mild gastritis, large non-bleeding duodenal diverticulum. Negative  H.pylori.    ESOPHAGOGASTRODUODENOSCOPY N/A 06/17/2021   Procedure: ESOPHAGOGASTRODUODENOSCOPY (EGD);  Surgeon: Daneil Dolin, MD;  [facility];  Service: Endoscopy;  Laterality: N/A;  10:30   MALONEY DILATION N/A 06/17/2021   Procedure: Venia Minks DILATION;  Surgeon: Daneil Dolin, MD;  [facility];  Service: Endoscopy;  Laterality: N/A;  EGD with [facility] Dilation   POLYPECTOMY  08/26/2017   Procedure: POLYPECTOMY;  Surgeon: Danie Binder, MD;  [facility];  Service: Endoscopy;;  cecal; transverse colon; ascending colon    POLYPECTOMY  09/30/2020   Procedure: POLYPECTOMY;  Surgeon: Eloise Harman, DO;  [facility];  Service: Endoscopy;;   SAVORY DILATION N/A 02/01/2020   Procedure: SAVORY DILATION;  Surgeon: Danie Binder, MD;  [facility];  Service: Endoscopy;  Laterality: N/A;   Social History:  reports that he has never smoked. He has never used smokeless tobacco. He reports that he does not drink alcohol and does not use drugs.  Allergies  Allergen Reactions   Amoxicillin Nausea And Vomiting   Tetracycline Itching    Family History  Problem Relation Age of Onset   Colon cancer Maternal Grandfather 91   Breast cancer Maternal Aunt      Prior to Admission medications   Medication Sig Start Date End Date Taking? Authorizing Provider  albuterol (PROVENTIL HFA;VENTOLIN HFA) 108 (90 BASE) MCG/ACT inhaler Inhale 2 puffs into the lungs as needed for wheezing or shortness of breath. Asthma    [provider]  aspirin 81 MG tablet Take 81 mg by mouth daily.    [provider]  Fluticasone-Salmeterol (ADVAIR) 250-50 MCG/DOSE AEPB Inhale 1 puff into the lungs at bedtime.    [provider]  lisinopril (PRINIVIL,ZESTRIL) 2.5 MG tablet Take 2.5 mg by mouth daily.    [provider]  loratadine (CLARITIN) 10 MG tablet Take 10 mg by mouth daily.    [provider]  metFORMIN  (GLUCOPHAGE) 500 MG tablet Take 500 mg by mouth 2 (two) times daily with a meal. Takes 2 tablets twice daily.  (2000 mg daily) 10/13/11   [provider]  montelukast (SINGULAIR) 10 MG tablet Take 10 mg by mouth at bedtime.    [provider]  Multiple Vitamin (MULTIVITAMIN) capsule Take 1 capsule by mouth daily.    [provider]  olopatadine (PATANOL) 0.1 % ophthalmic solution Place 1 drop into both eyes daily as needed for allergies.    [provider]  pantoprazole (PROTONIX) 40 MG tablet Take 1 tablet (40 mg total) by mouth daily before breakfast. 08/08/20   Annitta Needs, NP  rosuvastatin (CRESTOR) 10 MG tablet Take 10 mg by mouth every evening.     [provider]  theophylline (THEODUR) 300 MG 12 hr tablet Take 300 mg by mouth 2 (two) times daily. 10/19/11   [provider]   Physical Exam: Vitals:   06/24/21 1310 06/24/21 1311 06/24/21 1432  BP: 138/60  (!) 93/56  Pulse: 87  79  Resp: 18  17  Temp: 97.9 F (36.6 C)  98.6 F (37 C)  TempSrc:  Oral  SpO2: 100%  100%  Weight:  81.6 kg   Height:  5\' 6"  (1.676 m)     Wt Readings from Last 3 Encounters:  06/24/21 81.6 kg  06/17/21 81.6 kg  06/16/21 81.6 kg     General:  Appears calm and comfortable Eyes: PERRL, normal lids, irises & conjunctiva ENT: grossly normal hearing Cardiovascular: RRR, no m/r/g. No LE edema.  Respiratory: CTA bilaterally, no w/r/r. Normal respiratory effort. Abdomen: soft, ntnd, no masses Skin: no rash or induration seen on limited exam Musculoskeletal: grossly normal tone BUE/BLE Psychiatric: grossly normal mood and affect, speech fluent and appropriate Neurologic: grossly non-focal.          Labs on Admission:  Basic Metabolic Panel: Recent Labs  Lab 06/23/21 1530 06/24/21 1409  NA 133* 131*  K 3.6 4.1  CL 99 98  CO2 23 24  GLUCOSE 162* 119*  BUN 45* 41*  CREATININE 2.66* 2.52*  CALCIUM 10.2 10.3   Liver Function Tests: Recent  Labs  Lab 06/23/21 1530  AST 19  ALT 16  ALKPHOS 66  BILITOT 0.9  PROT 7.7  ALBUMIN 4.2   Recent Labs  Lab 06/24/21 1409  LIPASE 44   No results for input(s): AMMONIA in the last 168 hours. CBC: Recent Labs  Lab 06/23/21 1530  WBC 8.1  NEUTROABS 6.2  HGB 13.5  HCT 37.8*  MCV 91.1  PLT 259   Cardiac Enzymes: No results for input(s): CKTOTAL, CKMB, CKMBINDEX, TROPONINI in the last 168 hours.  BNP (last 3 results) No results for input(s): BNP in the last 8760 hours.  ProBNP (last 3 results) No results for input(s): PROBNP in the last 8760 hours.  CBG: No results for input(s): GLUCAP in the last 168 hours.  Radiological Exams on Admission: No results found.  EKG: Not obtained  Assessment/Plan Active Problems:   AKI (acute kidney injury) ([place])  Edwin Burgess is a 75 y.o. male with history of well-controlled type 2 diabetes, asthma, GERD complicated by esophageal strictures, colonic polyps, who presents to the [facility] emergency room at the instruction of his gastroenterologist after routine labs found significant renal abnormalities of unclear chronicity.  #AKI vs CKD Unclear if current renal function represents AKI or CKD since we unfortunately do not have any prior records.  Will fluid resuscitate, send usual labs, and reevaluate in the morning.  Suspect this is likely CKD related to longstanding diabetes and discussed as much with patient.  Reports no history of NSAIDs, albumin and protein as well as protein gap are normal as is the CBC so low suspicion for light chain disease.  Obstruction possible, ultrasound will be revealing if this is the case.  Interstitial nephritis also on differential but a diagnosis of exclusion. - Renal ultrasound - Urinalysis with microscopy - Urine protein creatinine ratio - Consider renal consultation in a.m. pending results  #Chronic medical problems GERD-continue PPI, may need to discontinue if interstitial nephritis is  higher on differential  T2DM-hold lisinopril and metformin in setting of elevated creatinine, 4 times daily fingersticks with very sensitive insulin sliding scale correction factor.  We will also check A1c.  Asthma-continue theophylline  Hyperlipidemia-continue rosuvastatin  Code Status: Full code, confirmed DVT Prophylaxis: SCDs Family Communication: Wife Kathlee Nations updated at bedside Disposition Plan: Place in observation, MedSurg  Time spent: 50 min  Clarnce Flock MD/MPH [facility]  Note:  This document was prepared using Systems analyst and may include unintentional dictation errors.

## 2021-06-24 NOTE — ED Triage Notes (Signed)
Pt states he was seen here yesterday and had abnormal high creatinine.  Pt was sent back here today by Dr. Gala Romney.

## 2021-06-25 DIAGNOSIS — K2100 Gastro-esophageal reflux disease with esophagitis, without bleeding: Secondary | ICD-10-CM

## 2021-06-25 DIAGNOSIS — I10 Essential (primary) hypertension: Secondary | ICD-10-CM | POA: Diagnosis not present

## 2021-06-25 DIAGNOSIS — E1121 Type 2 diabetes mellitus with diabetic nephropathy: Secondary | ICD-10-CM

## 2021-06-25 DIAGNOSIS — E860 Dehydration: Secondary | ICD-10-CM

## 2021-06-25 DIAGNOSIS — N179 Acute kidney failure, unspecified: Secondary | ICD-10-CM

## 2021-06-25 DIAGNOSIS — N1832 Chronic kidney disease, stage 3b: Secondary | ICD-10-CM

## 2021-06-25 LAB — BASIC METABOLIC PANEL
Anion gap: 8 (ref 5–15)
BUN: 31 mg/dL — ABNORMAL HIGH (ref 8–23)
CO2: 25 mmol/L (ref 22–32)
Calcium: 9.7 mg/dL (ref 8.9–10.3)
Chloride: 102 mmol/L (ref 98–111)
Creatinine, Ser: 2 mg/dL — ABNORMAL HIGH (ref 0.61–1.24)
GFR, Estimated: 34 mL/min — ABNORMAL LOW (ref >60)
Glucose, Bld: 98 mg/dL (ref 70–99)
Potassium: 3.5 mmol/L (ref 3.5–5.1)
Sodium: 135 mmol/L (ref 135–145)

## 2021-06-25 LAB — HEMOGLOBIN A1C
Hgb A1c MFr Bld: 7.5 % — ABNORMAL HIGH (ref 4.8–5.6)
Mean Plasma Glucose: 169 mg/dL

## 2021-06-25 LAB — GLUCOSE, CAPILLARY
Glucose-Capillary: 101 mg/dL — ABNORMAL HIGH (ref 70–99)
Glucose-Capillary: 114 mg/dL — ABNORMAL HIGH (ref 70–99)

## 2021-06-25 LAB — SARS CORONAVIRUS 2 (TAT 6-24 HRS): SARS Coronavirus 2: NEGATIVE

## 2021-06-25 MED ORDER — LISINOPRIL 2.5 MG PO TABS: 2.5000 mg | ORAL_TABLET | Freq: Every day | ORAL | Status: AC

## 2021-06-25 MED ORDER — PANTOPRAZOLE SODIUM 40 MG PO TBEC: 40.0000 mg | DELAYED_RELEASE_TABLET | Freq: Two times a day (BID) | ORAL | 3 refills | Status: AC

## 2021-06-25 NOTE — Discharge Summary (Signed)
Physician Discharge Summary  Edwin Burgess HDQ:[id number] DOB: 1946-04-13 DOA: 06/24/2021  PCP: Monico Blitz, MD  Admit date: 06/24/2021 Discharge date: 06/25/2021  Time spent: 35 minutes  Recommendations for Outpatient Follow-up:  Repeat basic metabolic panel to follow to lites and renal function; if is stable and safe to resume the use of lisinopril for renal protection. Reassess blood pressure and adjust antihypertensive regimen as needed. Continue close monitoring of patient's CBGs/A1c with further adjustment to hypoglycemia regimen as needed. Outpatient referral to nephrology service to establish care given history of ongoing chronic kidney disease.   Discharge Diagnoses:  Active Problems:   Acute renal failure superimposed on stage 3b chronic kidney disease (HCC)   Dehydration   Primary hypertension   Type 2 diabetes with nephropathy (HCC)   Gastroesophageal reflux disease with esophagitis without hemorrhage   Discharge Condition: Stable and improved; discharged home with instructions to follow-up with PCP and gastroenterology.  CODE STATUS: Full code.  Diet recommendation: Heart healthy/modified carbohydrate diet.  Filed Weights   06/24/21 1311  Weight: 81.6 kg    History of present illness:  As per H&P written by Dr. Dione Plover on 06/24/2021 Edwin Burgess is a 75 y.o. male with history of well-controlled type 2 diabetes, asthma, GERD complicated by esophageal strictures, colonic polyps, who presents to the [facility] emergency room at the instruction of his gastroenterologist after routine labs found significant renal abnormalities.   Patient recently underwent EGD on September 21 (about a week ago) and was found to have an esophageal stricture which was dilated, as well as some gastric and duodenal ulcers with benign pathology results.  He subsequently reported to his gastroenterologist that he continued to have feelings of weakness and nausea vomiting, and was sent for  CBC, CMP, TSH.  CBC and TSH were unremarkable, however CMP showed creatinine of 2.6 and he was instructed to go to the ED for further evaluation.  On my history patient reports about 5 weeks of just not feeling himself.  He is normally very active but is felt tired, weak, and frequently nauseous.  He reports since his EGD last week his swallowing has improved significantly, he also reports that he has had very low p.o. intake over the past few weeks because of his swallowing issues.  He does not recall ever being told that he has any problem with his kidneys, he goes to Dr. Manuella Ghazi at [facility] for his primary care.  He denies any abdominal pain, back pain, blood in his urine.  He denies regular use of any NSAIDs or other over-the-counter medicines.  He reports he is taking lisinopril 2.5 mg for some years at the direction of his PCP for renal protection.  He has also taken a PPI for many years.  He denies any history of smoking or exposure to chemical dyes.   In the ED vital signs unremarkable.  Repeat BMP shows creatinine of 2.5, very mild hyponatremia at 131.  He was given a 500 cc bolus and admitted for further work-up of his abnormal renal function.  Hospital Course:  1-acute on chronic renal failure: Chronic kidney disease stage IIIb at baseline -Appears to be secondary to dehydration/prerenal azotemia we will continue nephrotoxic agents. -Excellent improvement/response to fluid resuscitation -Creatinine down to 2.0 at discharge -Continue holding lisinopril also follow-up with PCP -Advised to maintain adequate hydration and to avoid the use of NSAIDs. -Patient will benefit of establishing care with nephrology service as an outpatient.  2-gastroesophageal flux disease -With  esophagitis and recent stretch of his esophagus -Continue PPI twice a day -Continue follow-up with gastroenterology as an outpatient.  3-type 2 diabetes mellitus with nephropathy -Will continue holding  lisinopril at discharge -Advised to maintain adequate hydration -Continue the use of metformin.  4-history of asthma Continue the use of the following -No requiring oxygen supplementation and denies wheezing or shortness of breath. -Continue the use of Singulair and Advair.  5-hyperlipidemia -Continue Crestor -Healthy diet encouraged.  6-left kidney pole calculus -No obstruction -Continue to maintain adequate hydration -Outpatient follow-up with urology if needed.;  No intervention required.  Procedures: See below for x-ray reports  Consultations: None  Discharge Exam: Vitals:   06/25/21 0135 06/25/21 0532  BP: 113/61 (!) 99/52  Pulse: 77 79  Resp: 18 16  Temp: 98.2 F (36.8 C) 98.3 F (36.8 C)  SpO2: 99% 99%    General: Afebrile, no chest pain, no nausea, no vomiting, no abdominal pain.  Reports good urine output and feeling ready to go home.  Patient denies dysuria and hematuria. Cardiovascular: S1 and S2, no rubs, no gallops, no JVD. Respiratory: Clear to auscultation bilaterally.  No using accessory muscle. Abdomen: Soft, nontender, nondistended, positive bowel sounds Extremities: No cyanosis or clubbing.  Discharge Instructions   Discharge Instructions     Diet - low sodium heart healthy   Complete by: As directed    Discharge instructions   Complete by: As directed    Take medications as prescribed Maintain adequate hydration Hold the use of lisinopril and to follow-up with PCP Follow heart healthy and modify carbohydrate diet. With the use of NSAIDs      Allergies as of 06/25/2021       Reactions   Amoxicillin Nausea And Vomiting   Tetracycline Itching        Medication List     TAKE these medications    albuterol 108 (90 Base) MCG/ACT inhaler Commonly known as: VENTOLIN HFA Inhale 2 puffs into the lungs as needed for wheezing or shortness of breath. Asthma   Fluticasone-Salmeterol 250-50 MCG/DOSE Aepb Commonly known as:  ADVAIR Inhale 1 puff into the lungs at bedtime.   lisinopril 2.5 MG tablet Commonly known as: ZESTRIL Take 1 tablet (2.5 mg total) by mouth daily. Hold onto follow-up with PCP. What changed: additional instructions   loratadine 10 MG tablet Commonly known as: CLARITIN Take 10 mg by mouth daily.   metFORMIN 500 MG tablet Commonly known as: GLUCOPHAGE Take 1,000 mg by mouth 2 (two) times daily with a meal.   montelukast 10 MG tablet Commonly known as: SINGULAIR Take 10 mg by mouth at bedtime.   multivitamin capsule Take 1 capsule by mouth daily.   pantoprazole 40 MG tablet Commonly known as: PROTONIX Take 1 tablet (40 mg total) by mouth 2 (two) times daily.   rosuvastatin 10 MG tablet Commonly known as: CRESTOR Take 10 mg by mouth every evening.   theophylline 300 MG 12 hr tablet Commonly known as: THEODUR Take 300 mg by mouth 2 (two) times daily.       Allergies  Allergen Reactions   Amoxicillin Nausea And Vomiting   Tetracycline Itching    Follow-up Information     Monico Blitz, MD. Schedule an appointment as soon as possible for a visit in 10 day(s).   Specialty: Internal Medicine Contact information: [place] [address] [phone]                 The results of significant diagnostics  from this hospitalization (including imaging, microbiology, ancillary and laboratory) are listed below for reference.    Significant Diagnostic Studies: US RENAL  Result Date: 06/24/2021 CLINICAL DATA:  Acute kidney injury EXAM: RENAL / URINARY TRACT ULTRASOUND COMPLETE COMPARISON:  None. FINDINGS: Right Kidney: Renal measurements: 11.7 x 4.9 x 4.5 cm = volume: 134.6 mL. Echogenicity within normal limits. No mass or hydronephrosis visualized. Left Kidney: Renal measurements: 10.9 x 5.1 x 5.2 cm = volume: 151.2 mL. Echogenicity within normal limits. No mass or hydronephrosis visualized. 5.9 cm left lower pole renal calculus. Bladder: Appears normal for  degree of bladder distention. Other: Increased hepatic echogenicity as can be seen with hepatic steatosis. IMPRESSION: 1.  Nonobstructing 5.9 mm left lower pole renal calculus. 2.  No obstructive uropathy. Electronically Signed   By: Kathreen Devoid M.D.   On: 06/24/2021 17:15    Microbiology: Recent Results (from the past 240 hour(s))  SARS CORONAVIRUS 2 (TAT 6-24 HRS) Nasopharyngeal Nasopharyngeal Swab     Status: None   Collection Time: 06/24/21  4:06 PM   Specimen: Nasopharyngeal Swab  Result Value Ref Range Status   SARS Coronavirus 2 NEGATIVE NEGATIVE Final    Comment: (NOTE) SARS-CoV-2 target nucleic acids are NOT DETECTED.  The SARS-CoV-2 RNA is generally detectable in upper and lower respiratory specimens during the acute phase of infection. Negative results do not preclude SARS-CoV-2 infection, do not rule out co-infections with other pathogens, and should not be used as the sole basis for treatment or other patient management decisions. Negative results must be combined with clinical observations, patient history, and epidemiological information. The expected result is Negative.  Fact Sheet for Patients: SugarRoll.be  Fact Sheet for Healthcare Providers: https://www.woods-mathews.com/  This test is not yet approved or cleared by the [place] FDA and  has been authorized for detection and/or diagnosis of SARS-CoV-2 by FDA under an Emergency Use Authorization (EUA). This EUA will remain  in effect (meaning this test can be used) for the duration of the COVID-19 declaration under Se ction 564(b)(1) of the Act, 21 U.S.C. section 360bbb-3(b)(1), unless the authorization is terminated or revoked sooner.  Performed at [facility], [place] [address] [place], [place] [address]     Labs: Basic Metabolic Panel: Recent Labs  Lab 06/23/21 1530 06/24/21 1409 06/25/21 0454  NA 133* 131* 135  K 3.6 4.1 3.5  CL 99 98 102  CO2  23 24 25   GLUCOSE 162* 119* 98  BUN 45* 41* 31*  CREATININE 2.66* 2.52* 2.00*  CALCIUM 10.2 10.3 9.7   Liver Function Tests: Recent Labs  Lab 06/23/21 1530  AST 19  ALT 16  ALKPHOS 66  BILITOT 0.9  PROT 7.7  ALBUMIN 4.2   Recent Labs  Lab 06/24/21 1409  LIPASE 44   CBC: Recent Labs  Lab 06/23/21 1530  WBC 8.1  NEUTROABS 6.2  HGB 13.5  HCT 37.8*  MCV 91.1  PLT 259   BNP (last 3 results) Recent Labs    06/24/21 1409  BNP 30.0   CBG: Recent Labs  Lab 06/24/21 1744 06/24/21 2127 06/25/21 0725 06/25/21 1122  GLUCAP 80 98 101* 114*    Signed:  Barton Dubois MD.  [facility] 06/25/2021, 12:08 PM

## 2021-07-03 DIAGNOSIS — Z299 Encounter for prophylactic measures, unspecified: Secondary | ICD-10-CM | POA: Diagnosis not present

## 2021-07-03 DIAGNOSIS — N179 Acute kidney failure, unspecified: Secondary | ICD-10-CM | POA: Diagnosis not present

## 2021-07-03 DIAGNOSIS — N200 Calculus of kidney: Secondary | ICD-10-CM | POA: Diagnosis not present

## 2021-07-03 DIAGNOSIS — K219 Gastro-esophageal reflux disease without esophagitis: Secondary | ICD-10-CM | POA: Diagnosis not present

## 2021-07-03 DIAGNOSIS — Z6830 Body mass index (BMI) 30.0-30.9, adult: Secondary | ICD-10-CM | POA: Diagnosis not present

## 2021-07-09 DIAGNOSIS — B351 Tinea unguium: Secondary | ICD-10-CM | POA: Diagnosis not present

## 2021-07-09 DIAGNOSIS — N189 Chronic kidney disease, unspecified: Secondary | ICD-10-CM | POA: Diagnosis not present

## 2021-07-09 DIAGNOSIS — I9589 Other hypotension: Secondary | ICD-10-CM | POA: Diagnosis not present

## 2021-07-09 DIAGNOSIS — L84 Corns and callosities: Secondary | ICD-10-CM | POA: Diagnosis not present

## 2021-07-09 DIAGNOSIS — N200 Calculus of kidney: Secondary | ICD-10-CM | POA: Diagnosis not present

## 2021-07-09 DIAGNOSIS — E1122 Type 2 diabetes mellitus with diabetic chronic kidney disease: Secondary | ICD-10-CM | POA: Diagnosis not present

## 2021-07-09 DIAGNOSIS — M79676 Pain in unspecified toe(s): Secondary | ICD-10-CM | POA: Diagnosis not present

## 2021-07-09 DIAGNOSIS — N170 Acute kidney failure with tubular necrosis: Secondary | ICD-10-CM | POA: Diagnosis not present

## 2021-07-09 DIAGNOSIS — E1142 Type 2 diabetes mellitus with diabetic polyneuropathy: Secondary | ICD-10-CM | POA: Diagnosis not present

## 2021-07-16 DIAGNOSIS — E1122 Type 2 diabetes mellitus with diabetic chronic kidney disease: Secondary | ICD-10-CM | POA: Diagnosis not present

## 2021-07-16 DIAGNOSIS — N189 Chronic kidney disease, unspecified: Secondary | ICD-10-CM | POA: Diagnosis not present

## 2021-07-16 DIAGNOSIS — N170 Acute kidney failure with tubular necrosis: Secondary | ICD-10-CM | POA: Diagnosis not present

## 2021-07-16 DIAGNOSIS — N200 Calculus of kidney: Secondary | ICD-10-CM | POA: Diagnosis not present

## 2021-07-17 DIAGNOSIS — Z299 Encounter for prophylactic measures, unspecified: Secondary | ICD-10-CM | POA: Diagnosis not present

## 2021-07-17 DIAGNOSIS — Z6830 Body mass index (BMI) 30.0-30.9, adult: Secondary | ICD-10-CM | POA: Diagnosis not present

## 2021-07-17 DIAGNOSIS — N179 Acute kidney failure, unspecified: Secondary | ICD-10-CM | POA: Diagnosis not present

## 2021-07-17 DIAGNOSIS — E1165 Type 2 diabetes mellitus with hyperglycemia: Secondary | ICD-10-CM | POA: Diagnosis not present

## 2021-07-17 DIAGNOSIS — I10 Essential (primary) hypertension: Secondary | ICD-10-CM | POA: Diagnosis not present

## 2021-07-17 DIAGNOSIS — N200 Calculus of kidney: Secondary | ICD-10-CM | POA: Diagnosis not present

## 2021-07-24 ENCOUNTER — Other Ambulatory Visit: Payer: Self-pay

## 2021-07-24 ENCOUNTER — Other Ambulatory Visit (HOSPITAL_COMMUNITY)
Admission: RE | Admit: 2021-07-24 | Discharge: 2021-07-24 | Disposition: A | Discharge status: Home / Self Care | Payer: Medicare PPO | Source: Ambulatory Visit | Attending: Nephrology | Admitting: Nephrology

## 2021-07-24 DIAGNOSIS — N200 Calculus of kidney: Secondary | ICD-10-CM | POA: Diagnosis not present

## 2021-07-24 DIAGNOSIS — N170 Acute kidney failure with tubular necrosis: Secondary | ICD-10-CM | POA: Insufficient documentation

## 2021-07-24 DIAGNOSIS — E1122 Type 2 diabetes mellitus with diabetic chronic kidney disease: Secondary | ICD-10-CM | POA: Diagnosis not present

## 2021-07-24 DIAGNOSIS — I9589 Other hypotension: Secondary | ICD-10-CM | POA: Diagnosis not present

## 2021-07-24 DIAGNOSIS — N189 Chronic kidney disease, unspecified: Secondary | ICD-10-CM | POA: Insufficient documentation

## 2021-07-24 LAB — CBC WITH DIFFERENTIAL/PLATELET
Abs Immature Granulocytes: 0.01 10*3/uL (ref 0.00–0.07)
Basophils Absolute: 0 10*3/uL (ref 0.0–0.1)
Basophils Relative: 1 %
Eosinophils Absolute: 0.1 10*3/uL (ref 0.0–0.5)
Eosinophils Relative: 3 %
HCT: 32 % — ABNORMAL LOW (ref 39.0–52.0)
Hemoglobin: 11.4 g/dL — ABNORMAL LOW (ref 13.0–17.0)
Immature Granulocytes: 0 %
Lymphocytes Relative: 21 %
Lymphs Abs: 0.8 10*3/uL (ref 0.7–4.0)
MCH: 33 pg (ref 26.0–34.0)
MCHC: 35.6 g/dL (ref 30.0–36.0)
MCV: 92.8 fL (ref 80.0–100.0)
Monocytes Absolute: 0.5 10*3/uL (ref 0.1–1.0)
Monocytes Relative: 12 %
Neutro Abs: 2.5 10*3/uL (ref 1.7–7.7)
Neutrophils Relative %: 63 %
Platelets: 233 10*3/uL (ref 150–400)
RBC: 3.45 MIL/uL — ABNORMAL LOW (ref 4.22–5.81)
RDW: 13.9 % (ref 11.5–15.5)
WBC: 4 10*3/uL (ref 4.0–10.5)
nRBC: 0 % (ref 0.0–0.2)

## 2021-07-24 LAB — PROTEIN, URINE, 24 HOUR
Collection Interval-UPROT: 1550 hours
Protein, 24H Urine: 7 mg/d — ABNORMAL LOW (ref 50–100)
Protein, Urine: 29 mg/dL
Urine Total Volume-UPROT: 1550 mL

## 2021-07-24 LAB — COMPREHENSIVE METABOLIC PANEL
ALT: 10 U/L (ref 0–44)
AST: 11 U/L — ABNORMAL LOW (ref 15–41)
Albumin: 3.7 g/dL (ref 3.5–5.0)
Alkaline Phosphatase: 57 U/L (ref 38–126)
Anion gap: 8 (ref 5–15)
BUN: 9 mg/dL (ref 8–23)
CO2: 28 mmol/L (ref 22–32)
Calcium: 9.8 mg/dL (ref 8.9–10.3)
Chloride: 101 mmol/L (ref 98–111)
Creatinine, Ser: 1.19 mg/dL (ref 0.61–1.24)
GFR, Estimated: >60 mL/min (ref >60)
Glucose, Bld: 182 mg/dL — ABNORMAL HIGH (ref 70–99)
Potassium: 3.4 mmol/L — ABNORMAL LOW (ref 3.5–5.1)
Sodium: 137 mmol/L (ref 135–145)
Total Bilirubin: 0.9 mg/dL (ref 0.3–1.2)
Total Protein: 6.8 g/dL (ref 6.5–8.1)

## 2021-07-24 LAB — URIC ACID: Uric Acid, Serum: 5.1 mg/dL (ref 3.7–8.6)

## 2021-07-24 LAB — IRON AND TIBC
Iron: 51 ug/dL (ref 45–182)
Saturation Ratios: 15 % — ABNORMAL LOW (ref 17.9–39.5)
TIBC: 350 ug/dL (ref 250–450)
UIBC: 299 ug/dL

## 2021-07-24 LAB — URINALYSIS, COMPLETE (UACMP) WITH MICROSCOPIC
Bacteria, UA: NONE SEEN
Bilirubin Urine: NEGATIVE
Glucose, UA: 50 mg/dL — AB
Ketones, ur: NEGATIVE mg/dL
Nitrite: NEGATIVE
Protein, ur: 30 mg/dL — AB
Specific Gravity, Urine: 1.012 (ref 1.005–1.030)
pH: 6 (ref 5.0–8.0)

## 2021-07-24 LAB — PROTEIN / CREATININE RATIO, URINE
Creatinine, Urine: 128.79 mg/dL
Protein Creatinine Ratio: 0.54 mg/mg{Cre} — ABNORMAL HIGH (ref 0.00–0.15)
Total Protein, Urine: 69 mg/dL

## 2021-07-24 LAB — CREATININE CLEARANCE, URINE, 24 HOUR
Collection Interval-CRCL: 1550 hours
Creatinine Clearance: 1 mL/min — ABNORMAL LOW (ref 75–125)
Creatinine, 24H Ur: 16 mg/d — ABNORMAL LOW (ref 800–2000)
Creatinine, Urine: 67.47 mg/dL
Urine Total Volume-CRCL: 1550 mL

## 2021-07-24 LAB — RAPID HIV SCREEN (HIV 1/2 AB+AG)
HIV 1/2 Antibodies: NONREACTIVE
HIV-1 P24 Antigen - HIV24: NONREACTIVE

## 2021-07-24 LAB — MAGNESIUM: Magnesium: 2 mg/dL (ref 1.7–2.4)

## 2021-07-24 LAB — HEMOGLOBIN A1C
Hgb A1c MFr Bld: 6.7 % — ABNORMAL HIGH (ref 4.8–5.6)
Mean Plasma Glucose: 145.59 mg/dL

## 2021-07-24 LAB — FERRITIN: Ferritin: 37 ng/mL (ref 24–336)

## 2021-07-24 LAB — HEPATITIS B SURFACE ANTIGEN: Hepatitis B Surface Ag: NONREACTIVE

## 2021-07-24 LAB — HEPATITIS C ANTIBODY: HCV Ab: NONREACTIVE

## 2021-07-24 LAB — PHOSPHORUS: Phosphorus: 3.2 mg/dL (ref 2.5–4.6)

## 2021-07-24 LAB — VITAMIN B12: Vitamin B-12: 377 pg/mL (ref 180–914)

## 2021-07-24 LAB — VITAMIN D 25 HYDROXY (VIT D DEFICIENCY, FRACTURES): Vit D, 25-Hydroxy: 38.25 ng/mL (ref 30–100)

## 2021-07-25 LAB — GLOMERULAR BASEMENT MEMBRANE ANTIBODIES: GBM Ab: <0.2 units (ref 0.0–0.9)

## 2021-07-25 LAB — PTH, INTACT AND CALCIUM
Calcium, Total (PTH): 10 mg/dL (ref 8.6–10.2)
PTH: 36 pg/mL (ref 15–65)

## 2021-07-25 LAB — C4 COMPLEMENT: Complement C4, Body Fluid: 41 mg/dL — ABNORMAL HIGH (ref 12–38)

## 2021-07-25 LAB — HEPATITIS B SURFACE ANTIBODY, QUANTITATIVE: Hep B S AB Quant (Post): 12.7 m[IU]/mL (ref >9.9)

## 2021-07-25 LAB — C3 COMPLEMENT: C3 Complement: 151 mg/dL (ref 82–167)

## 2021-07-25 LAB — ANA: Anti Nuclear Antibody (ANA): NEGATIVE

## 2021-07-27 LAB — PROTEIN ELECTROPHORESIS, SERUM
A/G Ratio: 1.2 (ref 0.7–1.7)
Albumin ELP: 3.4 g/dL (ref 2.9–4.4)
Alpha-1-Globulin: 0.3 g/dL (ref 0.0–0.4)
Alpha-2-Globulin: 0.9 g/dL (ref 0.4–1.0)
Beta Globulin: 0.9 g/dL (ref 0.7–1.3)
Gamma Globulin: 0.6 g/dL (ref 0.4–1.8)
Globulin, Total: 2.8 g/dL (ref 2.2–3.9)
Total Protein ELP: 6.2 g/dL (ref 6.0–8.5)

## 2021-07-27 LAB — UIFE/LIGHT CHAINS/TP QN, 24-HR UR
FR KAPPA LT CH,24HR: 204.49 mg/24 hr
FR LAMBDA LT CH,24HR: 65.53 mg/24 hr
Free Kappa Lt Chains,Ur: 131.93 mg/L — ABNORMAL HIGH (ref 1.17–86.46)
Free Kappa/Lambda Ratio: 3.12 (ref 1.83–14.26)
Free Lambda Lt Chains,Ur: 42.28 mg/L — ABNORMAL HIGH (ref 0.27–15.21)
Total Protein, Urine-Ur/day: 398 mg/24 hr — ABNORMAL HIGH (ref 30–150)
Total Protein, Urine: 25.7 mg/dL
Total Volume: 1550

## 2021-07-27 LAB — MISC LABCORP TEST (SEND OUT): Labcorp test code: 141330

## 2021-07-27 LAB — KAPPA/LAMBDA LIGHT CHAINS
Kappa free light chain: 35.4 mg/L — ABNORMAL HIGH (ref 3.3–19.4)
Kappa, lambda light chain ratio: 1.57 (ref 0.26–1.65)
Lambda free light chains: 22.6 mg/L (ref 5.7–26.3)

## 2021-07-28 LAB — HEPATITIS C GENOTYPE

## 2021-07-29 LAB — ANCA TITERS
Atypical P-ANCA titer: <1:20 {titer}
C-ANCA: <1:20 {titer}
P-ANCA: <1:20 {titer}

## 2021-07-29 LAB — IMMUNOFIXATION ELECTROPHORESIS
IgA: 204 mg/dL (ref 61–437)
IgG (Immunoglobin G), Serum: 760 mg/dL (ref 603–1613)
IgM (Immunoglobulin M), Srm: 19 mg/dL (ref 15–143)
Total Protein ELP: 6.4 g/dL (ref 6.0–8.5)

## 2021-08-07 DIAGNOSIS — D508 Other iron deficiency anemias: Secondary | ICD-10-CM | POA: Diagnosis not present

## 2021-08-07 DIAGNOSIS — N170 Acute kidney failure with tubular necrosis: Secondary | ICD-10-CM | POA: Diagnosis not present

## 2021-08-07 DIAGNOSIS — E1122 Type 2 diabetes mellitus with diabetic chronic kidney disease: Secondary | ICD-10-CM | POA: Diagnosis not present

## 2021-08-07 DIAGNOSIS — E876 Hypokalemia: Secondary | ICD-10-CM | POA: Diagnosis not present

## 2021-08-07 DIAGNOSIS — N200 Calculus of kidney: Secondary | ICD-10-CM | POA: Diagnosis not present

## 2021-08-07 DIAGNOSIS — E1129 Type 2 diabetes mellitus with other diabetic kidney complication: Secondary | ICD-10-CM | POA: Diagnosis not present

## 2021-08-07 DIAGNOSIS — R809 Proteinuria, unspecified: Secondary | ICD-10-CM | POA: Diagnosis not present

## 2021-08-07 DIAGNOSIS — N189 Chronic kidney disease, unspecified: Secondary | ICD-10-CM | POA: Diagnosis not present

## 2021-08-25 ENCOUNTER — Other Ambulatory Visit (HOSPITAL_COMMUNITY)
Admission: RE | Admit: 2021-08-25 | Discharge: 2021-08-25 | Disposition: A | Discharge status: Home / Self Care | Payer: Medicare PPO | Source: Ambulatory Visit | Attending: Nephrology | Admitting: Nephrology

## 2021-08-25 DIAGNOSIS — D508 Other iron deficiency anemias: Secondary | ICD-10-CM | POA: Diagnosis not present

## 2021-08-25 DIAGNOSIS — N200 Calculus of kidney: Secondary | ICD-10-CM | POA: Insufficient documentation

## 2021-08-25 DIAGNOSIS — N170 Acute kidney failure with tubular necrosis: Secondary | ICD-10-CM | POA: Diagnosis not present

## 2021-08-25 DIAGNOSIS — E1122 Type 2 diabetes mellitus with diabetic chronic kidney disease: Secondary | ICD-10-CM | POA: Diagnosis not present

## 2021-08-25 DIAGNOSIS — N189 Chronic kidney disease, unspecified: Secondary | ICD-10-CM | POA: Diagnosis not present

## 2021-08-25 DIAGNOSIS — E1129 Type 2 diabetes mellitus with other diabetic kidney complication: Secondary | ICD-10-CM | POA: Insufficient documentation

## 2021-08-25 DIAGNOSIS — E876 Hypokalemia: Secondary | ICD-10-CM | POA: Diagnosis not present

## 2021-08-25 DIAGNOSIS — R809 Proteinuria, unspecified: Secondary | ICD-10-CM | POA: Diagnosis not present

## 2021-08-25 LAB — RENAL FUNCTION PANEL
Albumin: 4.3 g/dL (ref 3.5–5.0)
Anion gap: 7 (ref 5–15)
BUN: 11 mg/dL (ref 8–23)
CO2: 28 mmol/L (ref 22–32)
Calcium: 10.8 mg/dL — ABNORMAL HIGH (ref 8.9–10.3)
Chloride: 103 mmol/L (ref 98–111)
Creatinine, Ser: 1.14 mg/dL (ref 0.61–1.24)
GFR, Estimated: >60 mL/min (ref >60)
Glucose, Bld: 166 mg/dL — ABNORMAL HIGH (ref 70–99)
Phosphorus: 3.4 mg/dL (ref 2.5–4.6)
Potassium: 3.8 mmol/L (ref 3.5–5.1)
Sodium: 138 mmol/L (ref 135–145)

## 2021-09-10 DIAGNOSIS — I10 Essential (primary) hypertension: Secondary | ICD-10-CM | POA: Diagnosis not present

## 2021-09-10 DIAGNOSIS — E1165 Type 2 diabetes mellitus with hyperglycemia: Secondary | ICD-10-CM | POA: Diagnosis not present

## 2021-09-10 DIAGNOSIS — J45909 Unspecified asthma, uncomplicated: Secondary | ICD-10-CM | POA: Diagnosis not present

## 2021-09-10 DIAGNOSIS — Z299 Encounter for prophylactic measures, unspecified: Secondary | ICD-10-CM | POA: Diagnosis not present

## 2021-09-10 DIAGNOSIS — G4730 Sleep apnea, unspecified: Secondary | ICD-10-CM | POA: Diagnosis not present

## 2021-09-17 DIAGNOSIS — M79676 Pain in unspecified toe(s): Secondary | ICD-10-CM | POA: Diagnosis not present

## 2021-09-17 DIAGNOSIS — B351 Tinea unguium: Secondary | ICD-10-CM | POA: Diagnosis not present

## 2021-09-17 DIAGNOSIS — L84 Corns and callosities: Secondary | ICD-10-CM | POA: Diagnosis not present

## 2021-09-17 DIAGNOSIS — E1142 Type 2 diabetes mellitus with diabetic polyneuropathy: Secondary | ICD-10-CM | POA: Diagnosis not present

## 2021-10-08 DIAGNOSIS — H43813 Vitreous degeneration, bilateral: Secondary | ICD-10-CM | POA: Diagnosis not present

## 2021-11-03 ENCOUNTER — Other Ambulatory Visit: Payer: Self-pay

## 2021-11-03 ENCOUNTER — Ambulatory Visit: Payer: Medicare PPO | Admitting: Gastroenterology

## 2021-11-03 ENCOUNTER — Encounter: Payer: Self-pay | Admitting: Gastroenterology

## 2021-11-03 DIAGNOSIS — D509 Iron deficiency anemia, unspecified: Secondary | ICD-10-CM

## 2021-11-03 NOTE — Patient Instructions (Addendum)
Decrease Protonix to once daily if tolerated. You can increase it back to twice a day if needed.  We will see you in 3 months. We shall see what your iron levels are doing!  Please call if any blood in stools, abdominal pain, problems swallowing, nausea, vomiting, or weight loss!  I enjoyed seeing you again today! As you know, I value our relationship and want to provide genuine, compassionate, and quality care. I welcome your feedback. If you receive a survey regarding your visit,  I greatly appreciate you taking time to fill this out. See you next time!  Annitta Needs, PhD, ANP-BC [facility]

## 2021-11-03 NOTE — Progress Notes (Signed)
Referring Provider: Monico Blitz, MD Primary Care Physician:  Monico Blitz, MD Primary GI: Dr. Abbey Chatters   Chief Complaint  Patient presents with   Gastroesophageal Reflux    Med helping. Swallowing ok    HPI:   Edwin Burgess is a 76 y.o. male presenting today with a history of GERD and dysphagia, recently undergoing EGD Sept 2022 with short peptic esophageal stricture s/p dilation, small hiatal hernia, gastric erosions s/p biopsy, multiple bulbar ulcers and erosions. Gastric mucosa with hyperemia. Negative H.pylori. History of adenomas with surveillance due in 2027.   Inpatient with acute renal injury Sept 2022 due to dehydration. Followed by Nephrology now. In October 2022, Ferritin low normal at 37. Iron 51. Hgb 11.4 . Per Nephrology note, requested assistance from GI.   Protonix BID.   Dysphagia resolved.  Colonoscopy last year. EGD recently. Wants to hold off on capsule study until after repeat labs with Nephrology.  Does not believe he will be able to swallow the pill.   No overt GI bleeding. No abdominal pain. Dysphagia resolved. Doing well today.    Past Medical History:  Diagnosis Date   Asthma    DM (diabetes mellitus) ([place])    GERD (gastroesophageal reflux disease)    Hyperlipidemia    Hypertension    Personal history of colonic polyps    Sleep apnea     Past Surgical History:  Procedure Laterality Date   BIOPSY  02/01/2020   Procedure: BIOPSY;  Surgeon: Danie Binder, MD;  [facility];  Service: Endoscopy;;   BIOPSY  06/17/2021   Procedure: BIOPSY;  Surgeon: Daneil Dolin, MD;  [facility];  Service: Endoscopy;;   CATARACT EXTRACTION, BILATERAL     COLONOSCOPY  04/30/2006   dr.fleishman/mild diverticulosis. recommended to have next TCS 2012.   COLONOSCOPY  06/15/2012   NOM:[facility] internal hemorrhoids/ Mild diverticulosis/Eight sessile polyps measuring 2-6 mm (tubular adenomas). Surveillance in 5 years due to multiple  adenomas   COLONOSCOPY N/A 08/26/2017   One 2 mm polyps in ascending colon, seven 3-6 mm polyps in sigmoid, transverse, and cecum, diverticulosis in rectosigmoid, descending, and cecum, external and internal hemorrhoids. (seven simple adenomas and one hyperplastic polyp). 3 year surveillance    COLONOSCOPY WITH PROPOFOL N/A 09/30/2020   Non-bleeding internal hemorrhoids. Diverticulosis in the sigmoid colon and in the descending colon. One 8 mm polyp in the ascending colon, removed (tubular adenoma).. Two 3 to 4 mm polyps in the sigmoid colon, removed, Resected and retrieved (tubular adenoma). There was significant looping of the colon.   ESOPHAGOGASTRODUODENOSCOPY  07/06/2010   tight schatzki ring s/p dilation/small hiatal hernia   ESOPHAGOGASTRODUODENOSCOPY N/A 02/01/2020    one benign-appearing intrinsic mild stenosis s/p dilation, mild gastritis, large non-bleeding duodenal diverticulum. Negative H.pylori.    ESOPHAGOGASTRODUODENOSCOPY N/A 06/17/2021   short peptic esophageal stricture s/p dilation, small hiatal hernia, gastric erosions s/p biopsy, multiple bulbar ulcers and erosions. Gastric mucosa with hyperemia. Negative H.pylori.   MALONEY DILATION N/A 06/17/2021   Procedure: Venia Minks DILATION;  Surgeon: Daneil Dolin, MD;  [facility];  Service: Endoscopy;  Laterality: N/A;  EGD with [facility] Dilation   POLYPECTOMY  08/26/2017   Procedure: POLYPECTOMY;  Surgeon: Danie Binder, MD;  [facility];  Service: Endoscopy;;  cecal; transverse colon; ascending colon    POLYPECTOMY  09/30/2020   Procedure: POLYPECTOMY;  Surgeon: Eloise Harman, DO;  [facility];  Service: Endoscopy;;  SAVORY DILATION N/A 02/01/2020   Procedure: SAVORY DILATION;  Surgeon: Danie Binder, MD;  [facility];  Service: Endoscopy;  Laterality: N/A;    Current Outpatient Medications  Medication Sig Dispense Refill   albuterol (PROVENTIL HFA;VENTOLIN HFA) 108 (90  BASE) MCG/ACT inhaler Inhale 2 puffs into the lungs as needed for wheezing or shortness of breath. Asthma     Fluticasone-Salmeterol (ADVAIR) 250-50 MCG/DOSE AEPB Inhale 1 puff into the lungs at bedtime.     lisinopril (ZESTRIL) 2.5 MG tablet Take 1 tablet (2.5 mg total) by mouth daily. Hold onto follow-up with PCP.     loratadine (CLARITIN) 10 MG tablet Take 10 mg by mouth daily.     metFORMIN (GLUCOPHAGE) 500 MG tablet Take 1,000 mg by mouth 2 (two) times daily with a meal.     montelukast (SINGULAIR) 10 MG tablet Take 10 mg by mouth at bedtime.     Multiple Vitamin (MULTIVITAMIN) capsule Take 1 capsule by mouth daily.     pantoprazole (PROTONIX) 40 MG tablet Take 1 tablet (40 mg total) by mouth 2 (two) times daily. 60 tablet 3   rosuvastatin (CRESTOR) 10 MG tablet Take 10 mg by mouth every evening.      theophylline (THEODUR) 300 MG 12 hr tablet Take 300 mg by mouth 2 (two) times daily.     No current facility-administered medications for this visit.    Allergies as of 11/03/2021 - Review Complete 11/03/2021  Allergen Reaction Noted   Amoxicillin Nausea And Vomiting 06/16/2021   Tetracycline Itching     Family History  Problem Relation Age of Onset   Colon cancer Maternal Grandfather 91   Breast cancer Maternal Aunt     Social History   Socioeconomic History   Marital status: Married    Spouse name: Not on file   Number of children: 2   Years of education: Not on file   Highest education level: Not on file  Occupational History   Occupation: Designer, television/film set   Occupation: retired Education officer, museum  Tobacco Use   Smoking status: Never   Smokeless tobacco: Never  Scientific laboratory technician Use: Never used  Substance and Sexual Activity   Alcohol use: No   Drug use: No   Sexual activity: Not on file  Other Topics Concern   Not on file  Social History Narrative   Not on file   Social Determinants of Health   Financial Resource Strain: Not on file  Food Insecurity: Not on  file  Transportation Needs: Not on file  Physical Activity: Not on file  Stress: Not on file  Social Connections: Not on file    Review of Systems: Gen: Denies fever, chills, anorexia. Denies fatigue, weakness, weight loss.  CV: Denies chest pain, palpitations, syncope, peripheral edema, and claudication. Resp: Denies dyspnea at rest, cough, wheezing, coughing up blood, and pleurisy. GI: see HPI Derm: Denies rash, itching, dry skin Psych: Denies depression, anxiety, memory loss, confusion. No homicidal or suicidal ideation.  Heme: Denies bruising, bleeding, and enlarged lymph nodes.  Physical Exam: BP 123/70    Pulse 78    Temp (!) 97.5 F (36.4 C) (Temporal)    Ht 5\' 6"  (1.676 m)    Wt 176 lb 6.4 oz (80 kg)    BMI 28.47 kg/m  General:   Alert and oriented. No distress noted. Pleasant and cooperative.  Head:  Normocephalic and atraumatic. Eyes:  Conjuctiva clear without scleral icterus. Mouth:  mask in place  Abdomen:  +BS, soft, non-tender and non-distended. No rebound or guarding. No HSM or masses noted. Msk:  with kyphosis  Extremities:  Without edema. Neurologic:  Alert and  oriented x4 Psych:  Alert and cooperative. Normal mood and affect.  ASSESSMENT: NOBORU BIDINGER is a 76 y.o. male presenting today with a history of GERD and dysphagia, recently undergoing EGD Sept 2022 with short peptic esophageal stricture s/p dilation, small hiatal hernia, gastric erosions s/p biopsy, multiple bulbar ulcers and erosions. Gastric mucosa with hyperemia. Negative H.pylori. History of adenomas with surveillance due in 2027.   IDA: noted per Nephrology. No overt GI Bleeding. Colonoscopy one year ago and EGD recently. He is declining capsule study as afraid will not be able to swallow pill. He would like to hold off on evaluation until he sees Nephrology again.  Dysphagia: resolved s/p dilation. Continue PPI but can reduce to once daily if tolerated.    PLAN:  Decrease Protonix to once  daily if tolerated Continue oral iron Keep follow-up with Nephrology Recommend capsule study due to [place] when willing Return in 3 months  Annitta Needs, PhD, [facility] Gastroenterology

## 2021-11-05 ENCOUNTER — Other Ambulatory Visit (HOSPITAL_COMMUNITY)
Admission: RE | Admit: 2021-11-05 | Discharge: 2021-11-05 | Disposition: A | Discharge status: Home / Self Care | Payer: Medicare PPO | Source: Ambulatory Visit | Attending: Nephrology | Admitting: Nephrology

## 2021-11-05 DIAGNOSIS — E1129 Type 2 diabetes mellitus with other diabetic kidney complication: Secondary | ICD-10-CM | POA: Diagnosis not present

## 2021-11-05 DIAGNOSIS — E1122 Type 2 diabetes mellitus with diabetic chronic kidney disease: Secondary | ICD-10-CM | POA: Insufficient documentation

## 2021-11-05 DIAGNOSIS — N200 Calculus of kidney: Secondary | ICD-10-CM | POA: Insufficient documentation

## 2021-11-05 DIAGNOSIS — E876 Hypokalemia: Secondary | ICD-10-CM | POA: Diagnosis not present

## 2021-11-05 DIAGNOSIS — N170 Acute kidney failure with tubular necrosis: Secondary | ICD-10-CM | POA: Insufficient documentation

## 2021-11-05 DIAGNOSIS — N189 Chronic kidney disease, unspecified: Secondary | ICD-10-CM | POA: Insufficient documentation

## 2021-11-05 DIAGNOSIS — R809 Proteinuria, unspecified: Secondary | ICD-10-CM | POA: Insufficient documentation

## 2021-11-05 DIAGNOSIS — D508 Other iron deficiency anemias: Secondary | ICD-10-CM | POA: Insufficient documentation

## 2021-11-05 LAB — CBC
HCT: 38.9 % — ABNORMAL LOW (ref 39.0–52.0)
Hemoglobin: 13.5 g/dL (ref 13.0–17.0)
MCH: 31 pg (ref 26.0–34.0)
MCHC: 34.7 g/dL (ref 30.0–36.0)
MCV: 89.2 fL (ref 80.0–100.0)
Platelets: 222 10*3/uL (ref 150–400)
RBC: 4.36 MIL/uL (ref 4.22–5.81)
RDW: 12.4 % (ref 11.5–15.5)
WBC: 6 10*3/uL (ref 4.0–10.5)
nRBC: 0 % (ref 0.0–0.2)

## 2021-11-05 LAB — PROTEIN / CREATININE RATIO, URINE
Creatinine, Urine: 61.89 mg/dL
Protein Creatinine Ratio: 0.24 mg/mg{Cre} — ABNORMAL HIGH (ref 0.00–0.15)
Total Protein, Urine: 15 mg/dL

## 2021-11-05 LAB — RENAL FUNCTION PANEL
Albumin: 4 g/dL (ref 3.5–5.0)
Anion gap: 7 (ref 5–15)
BUN: 17 mg/dL (ref 8–23)
CO2: 26 mmol/L (ref 22–32)
Calcium: 9.8 mg/dL (ref 8.9–10.3)
Chloride: 100 mmol/L (ref 98–111)
Creatinine, Ser: 1.3 mg/dL — ABNORMAL HIGH (ref 0.61–1.24)
GFR, Estimated: 57 mL/min — ABNORMAL LOW (ref >60)
Glucose, Bld: 226 mg/dL — ABNORMAL HIGH (ref 70–99)
Phosphorus: 3.3 mg/dL (ref 2.5–4.6)
Potassium: 4 mmol/L (ref 3.5–5.1)
Sodium: 133 mmol/L — ABNORMAL LOW (ref 135–145)

## 2021-11-05 LAB — FERRITIN: Ferritin: 24 ng/mL (ref 24–336)

## 2021-11-05 LAB — IRON AND TIBC
Iron: 243 ug/dL — ABNORMAL HIGH (ref 45–182)
Saturation Ratios: 75 % — ABNORMAL HIGH (ref 17.9–39.5)
TIBC: 322 ug/dL (ref 250–450)
UIBC: 79 ug/dL

## 2021-11-10 DIAGNOSIS — E871 Hypo-osmolality and hyponatremia: Secondary | ICD-10-CM | POA: Diagnosis not present

## 2021-11-10 DIAGNOSIS — N200 Calculus of kidney: Secondary | ICD-10-CM | POA: Diagnosis not present

## 2021-11-10 DIAGNOSIS — R809 Proteinuria, unspecified: Secondary | ICD-10-CM | POA: Diagnosis not present

## 2021-11-10 DIAGNOSIS — E1122 Type 2 diabetes mellitus with diabetic chronic kidney disease: Secondary | ICD-10-CM | POA: Diagnosis not present

## 2021-11-10 DIAGNOSIS — I129 Hypertensive chronic kidney disease with stage 1 through stage 4 chronic kidney disease, or unspecified chronic kidney disease: Secondary | ICD-10-CM | POA: Diagnosis not present

## 2021-11-10 DIAGNOSIS — E1129 Type 2 diabetes mellitus with other diabetic kidney complication: Secondary | ICD-10-CM | POA: Diagnosis not present

## 2021-11-10 DIAGNOSIS — N189 Chronic kidney disease, unspecified: Secondary | ICD-10-CM | POA: Diagnosis not present

## 2021-12-03 DIAGNOSIS — E1142 Type 2 diabetes mellitus with diabetic polyneuropathy: Secondary | ICD-10-CM | POA: Diagnosis not present

## 2021-12-03 DIAGNOSIS — B351 Tinea unguium: Secondary | ICD-10-CM | POA: Diagnosis not present

## 2021-12-03 DIAGNOSIS — M79676 Pain in unspecified toe(s): Secondary | ICD-10-CM | POA: Diagnosis not present

## 2021-12-03 DIAGNOSIS — L84 Corns and callosities: Secondary | ICD-10-CM | POA: Diagnosis not present

## 2021-12-11 DIAGNOSIS — Z299 Encounter for prophylactic measures, unspecified: Secondary | ICD-10-CM | POA: Diagnosis not present

## 2021-12-11 DIAGNOSIS — E1165 Type 2 diabetes mellitus with hyperglycemia: Secondary | ICD-10-CM | POA: Diagnosis not present

## 2021-12-11 DIAGNOSIS — I10 Essential (primary) hypertension: Secondary | ICD-10-CM | POA: Diagnosis not present

## 2021-12-11 DIAGNOSIS — Z713 Dietary counseling and surveillance: Secondary | ICD-10-CM | POA: Diagnosis not present

## 2022-01-12 ENCOUNTER — Other Ambulatory Visit (HOSPITAL_COMMUNITY)
Admission: RE | Admit: 2022-01-12 | Discharge: 2022-01-12 | Disposition: A | Discharge status: Home / Self Care | Payer: Medicare PPO | Source: Ambulatory Visit | Attending: Nephrology | Admitting: Nephrology

## 2022-01-12 DIAGNOSIS — N189 Chronic kidney disease, unspecified: Secondary | ICD-10-CM | POA: Insufficient documentation

## 2022-01-12 LAB — CBC
HCT: 40.5 % (ref 39.0–52.0)
Hemoglobin: 15 g/dL (ref 13.0–17.0)
MCH: 34.2 pg — ABNORMAL HIGH (ref 26.0–34.0)
MCHC: 37 g/dL — ABNORMAL HIGH (ref 30.0–36.0)
MCV: 92.5 fL (ref 80.0–100.0)
Platelets: 229 10*3/uL (ref 150–400)
RBC: 4.38 MIL/uL (ref 4.22–5.81)
RDW: 12.3 % (ref 11.5–15.5)
WBC: 5 10*3/uL (ref 4.0–10.5)
nRBC: 0 % (ref 0.0–0.2)

## 2022-01-12 LAB — RENAL FUNCTION PANEL
Albumin: 4.1 g/dL (ref 3.5–5.0)
Anion gap: 7 (ref 5–15)
BUN: 19 mg/dL (ref 8–23)
CO2: 24 mmol/L (ref 22–32)
Calcium: 10.1 mg/dL (ref 8.9–10.3)
Chloride: 105 mmol/L (ref 98–111)
Creatinine, Ser: 1.34 mg/dL — ABNORMAL HIGH (ref 0.61–1.24)
GFR, Estimated: 55 mL/min — ABNORMAL LOW (ref >60)
Glucose, Bld: 248 mg/dL — ABNORMAL HIGH (ref 70–99)
Phosphorus: 3.4 mg/dL (ref 2.5–4.6)
Potassium: 4.1 mmol/L (ref 3.5–5.1)
Sodium: 136 mmol/L (ref 135–145)

## 2022-01-12 LAB — PROTEIN / CREATININE RATIO, URINE
Creatinine, Urine: 64.49 mg/dL
Protein Creatinine Ratio: 0.22 mg/mg{Cre} — ABNORMAL HIGH (ref 0.00–0.15)
Total Protein, Urine: 14 mg/dL

## 2022-01-14 DIAGNOSIS — N200 Calculus of kidney: Secondary | ICD-10-CM | POA: Diagnosis not present

## 2022-01-14 DIAGNOSIS — R809 Proteinuria, unspecified: Secondary | ICD-10-CM | POA: Diagnosis not present

## 2022-01-14 DIAGNOSIS — E1129 Type 2 diabetes mellitus with other diabetic kidney complication: Secondary | ICD-10-CM | POA: Diagnosis not present

## 2022-01-14 DIAGNOSIS — N189 Chronic kidney disease, unspecified: Secondary | ICD-10-CM | POA: Diagnosis not present

## 2022-01-14 DIAGNOSIS — E1122 Type 2 diabetes mellitus with diabetic chronic kidney disease: Secondary | ICD-10-CM | POA: Diagnosis not present

## 2022-01-14 DIAGNOSIS — B3742 Candidal balanitis: Secondary | ICD-10-CM | POA: Diagnosis not present

## 2022-01-14 DIAGNOSIS — U071 COVID-19: Secondary | ICD-10-CM | POA: Diagnosis not present

## 2022-01-14 DIAGNOSIS — I129 Hypertensive chronic kidney disease with stage 1 through stage 4 chronic kidney disease, or unspecified chronic kidney disease: Secondary | ICD-10-CM | POA: Diagnosis not present

## 2022-02-03 DIAGNOSIS — Z7189 Other specified counseling: Secondary | ICD-10-CM | POA: Diagnosis not present

## 2022-02-03 DIAGNOSIS — Z1331 Encounter for screening for depression: Secondary | ICD-10-CM | POA: Diagnosis not present

## 2022-02-03 DIAGNOSIS — J45909 Unspecified asthma, uncomplicated: Secondary | ICD-10-CM | POA: Diagnosis not present

## 2022-02-03 DIAGNOSIS — Z299 Encounter for prophylactic measures, unspecified: Secondary | ICD-10-CM | POA: Diagnosis not present

## 2022-02-03 DIAGNOSIS — I10 Essential (primary) hypertension: Secondary | ICD-10-CM | POA: Diagnosis not present

## 2022-02-03 DIAGNOSIS — E7800 Pure hypercholesterolemia, unspecified: Secondary | ICD-10-CM | POA: Diagnosis not present

## 2022-02-03 DIAGNOSIS — Z1339 Encounter for screening examination for other mental health and behavioral disorders: Secondary | ICD-10-CM | POA: Diagnosis not present

## 2022-02-03 DIAGNOSIS — Z0000 Encounter for general adult medical examination without abnormal findings: Secondary | ICD-10-CM | POA: Diagnosis not present

## 2022-02-03 DIAGNOSIS — Z6830 Body mass index (BMI) 30.0-30.9, adult: Secondary | ICD-10-CM | POA: Diagnosis not present

## 2022-02-04 DIAGNOSIS — Z79899 Other long term (current) drug therapy: Secondary | ICD-10-CM | POA: Diagnosis not present

## 2022-02-04 DIAGNOSIS — R5383 Other fatigue: Secondary | ICD-10-CM | POA: Diagnosis not present

## 2022-02-04 DIAGNOSIS — E7800 Pure hypercholesterolemia, unspecified: Secondary | ICD-10-CM | POA: Diagnosis not present

## 2022-02-04 DIAGNOSIS — Z125 Encounter for screening for malignant neoplasm of prostate: Secondary | ICD-10-CM | POA: Diagnosis not present

## 2022-02-11 DIAGNOSIS — L84 Corns and callosities: Secondary | ICD-10-CM | POA: Diagnosis not present

## 2022-02-11 DIAGNOSIS — E1142 Type 2 diabetes mellitus with diabetic polyneuropathy: Secondary | ICD-10-CM | POA: Diagnosis not present

## 2022-02-11 DIAGNOSIS — M79676 Pain in unspecified toe(s): Secondary | ICD-10-CM | POA: Diagnosis not present

## 2022-02-11 DIAGNOSIS — B351 Tinea unguium: Secondary | ICD-10-CM | POA: Diagnosis not present

## 2022-02-18 ENCOUNTER — Encounter: Payer: Self-pay | Admitting: Gastroenterology

## 2022-02-18 ENCOUNTER — Ambulatory Visit: Payer: Medicare PPO | Admitting: Gastroenterology

## 2022-02-18 VITALS — BP 110/70 | HR 91 | Temp 96.9°F | Ht 66.0 in | Wt 170.2 lb

## 2022-02-18 DIAGNOSIS — D509 Iron deficiency anemia, unspecified: Secondary | ICD-10-CM

## 2022-02-18 DIAGNOSIS — K219 Gastro-esophageal reflux disease without esophagitis: Secondary | ICD-10-CM | POA: Diagnosis not present

## 2022-02-18 NOTE — Progress Notes (Signed)
Gastroenterology Office Note     Primary Care Physician:  Monico Blitz, MD  Primary Gastroenterologist: Dr. Abbey Chatters   Chief Complaint   Chief Complaint  Patient presents with   Follow-up    No current issues.      History of Present Illness   Edwin Burgess is a 76 y.o. male presenting today in follow-up with a history of GERD and dysphagia, undergoing EGD Sept 2022 with short peptic esophageal stricture s/p dilation, small hiatal hernia, gastric erosions s/p biopsy, multiple bulbar ulcers and erosions. Gastric mucosa with hyperemia. Negative H.pylori. History of adenomas with surveillance due in 2027. IDA noted by Nephrology several months ago. Declined capsule study. Still not interested in capsule study.   On iron once daily. Pantoprazole once daily, down from BID for several months. No abdominal pain. No dysphagia. No constipation/diarrhea. No overt GI bleeding. Has no GI concerns today.   Recent Hgb 15. Sees Nephrology every 3 months.      Past Medical History:  Diagnosis Date   Asthma    DM (diabetes mellitus) ([place])    GERD (gastroesophageal reflux disease)    Hyperlipidemia    Hypertension    Personal history of colonic polyps    Sleep apnea     Past Surgical History:  Procedure Laterality Date   BIOPSY  02/01/2020   Procedure: BIOPSY;  Surgeon: Danie Binder, MD;  [facility];  Service: Endoscopy;;   BIOPSY  06/17/2021   Procedure: BIOPSY;  Surgeon: Daneil Dolin, MD;  [facility];  Service: Endoscopy;;   CATARACT EXTRACTION, BILATERAL     COLONOSCOPY  04/30/2006   dr.fleishman/mild diverticulosis. recommended to have next TCS 2012.   COLONOSCOPY  06/15/2012   IOX:[facility] internal hemorrhoids/ Mild diverticulosis/Eight sessile polyps measuring 2-6 mm (tubular adenomas). Surveillance in 5 years due to multiple adenomas   COLONOSCOPY N/A 08/26/2017   One 2 mm polyps in ascending colon, seven 3-6 mm polyps in sigmoid,  transverse, and cecum, diverticulosis in rectosigmoid, descending, and cecum, external and internal hemorrhoids. (seven simple adenomas and one hyperplastic polyp). 3 year surveillance    COLONOSCOPY WITH PROPOFOL N/A 09/30/2020   Non-bleeding internal hemorrhoids. Diverticulosis in the sigmoid colon and in the descending colon. One 8 mm polyp in the ascending colon, removed (tubular adenoma).. Two 3 to 4 mm polyps in the sigmoid colon, removed, Resected and retrieved (tubular adenoma). There was significant looping of the colon.   ESOPHAGOGASTRODUODENOSCOPY  07/06/2010   tight schatzki ring s/p dilation/small hiatal hernia   ESOPHAGOGASTRODUODENOSCOPY N/A 02/01/2020    one benign-appearing intrinsic mild stenosis s/p dilation, mild gastritis, large non-bleeding duodenal diverticulum. Negative H.pylori.    ESOPHAGOGASTRODUODENOSCOPY N/A 06/17/2021   short peptic esophageal stricture s/p dilation, small hiatal hernia, gastric erosions s/p biopsy, multiple bulbar ulcers and erosions. Gastric mucosa with hyperemia. Negative H.pylori.   MALONEY DILATION N/A 06/17/2021   Procedure: Venia Minks DILATION;  Surgeon: Daneil Dolin, MD;  [facility];  Service: Endoscopy;  Laterality: N/A;  EGD with [facility] Dilation   POLYPECTOMY  08/26/2017   Procedure: POLYPECTOMY;  Surgeon: Danie Binder, MD;  [facility];  Service: Endoscopy;;  cecal; transverse colon; ascending colon    POLYPECTOMY  09/30/2020   Procedure: POLYPECTOMY;  Surgeon: Eloise Harman, DO;  [facility];  Service: Endoscopy;;   SAVORY DILATION N/A 02/01/2020   Procedure: SAVORY DILATION;  Surgeon: Danie Binder, MD;  [facility];  Service: Endoscopy;  Laterality: N/A;    Current Outpatient Medications  Medication Sig Dispense Refill   albuterol (PROVENTIL HFA;VENTOLIN HFA) 108 (90 BASE) MCG/ACT inhaler Inhale 2 puffs into the lungs as needed for wheezing or shortness of breath. Asthma      FARXIGA 5 MG TABS tablet Take 5 mg by mouth daily.     ferrous sulfate 325 (65 FE) MG tablet Take 325 mg by mouth daily with breakfast.     lisinopril (ZESTRIL) 2.5 MG tablet Take 1 tablet (2.5 mg total) by mouth daily. Hold onto follow-up with PCP.     metFORMIN (GLUCOPHAGE) 500 MG tablet Take 500 mg by mouth daily with breakfast.     montelukast (SINGULAIR) 10 MG tablet Take 10 mg by mouth at bedtime.     pantoprazole (PROTONIX) 40 MG tablet Take 1 tablet (40 mg total) by mouth 2 (two) times daily. (Patient taking differently: Take 40 mg by mouth daily.) 60 tablet 3   rosuvastatin (CRESTOR) 10 MG tablet Take 10 mg by mouth every evening.      theophylline (THEODUR) 300 MG 12 hr tablet Take 300 mg by mouth 2 (two) times daily.     No current facility-administered medications for this visit.    Allergies as of 02/18/2022 - Review Complete 02/18/2022  Allergen Reaction Noted   Amoxicillin Nausea And Vomiting 06/16/2021   Tetracycline Itching     Family History  Problem Relation Age of Onset   Colon cancer Maternal Grandfather 91   Breast cancer Maternal Aunt     Social History   Socioeconomic History   Marital status: Married    Spouse name: Not on file   Number of children: 2   Years of education: Not on file   Highest education level: Not on file  Occupational History   Occupation: Designer, television/film set   Occupation: retired Education officer, museum  Tobacco Use   Smoking status: Never   Smokeless tobacco: Never  Scientific laboratory technician Use: Never used  Substance and Sexual Activity   Alcohol use: No   Drug use: No   Sexual activity: Yes  Other Topics Concern   Not on file  Social History Narrative   Not on file   Social Determinants of Health   Financial Resource Strain: Not on file  Food Insecurity: Not on file  Transportation Needs: Not on file  Physical Activity: Not on file  Stress: Not on file  Social Connections: Not on file  Intimate Partner Violence: Not on file      Review of Systems   Gen: Denies any fever, chills, fatigue, weight loss, lack of appetite.  CV: Denies chest pain, heart palpitations, peripheral edema, syncope.  Resp: Denies shortness of breath at rest or with exertion. Denies wheezing or cough.  GI: see HPI GU : Denies urinary burning, urinary frequency, urinary hesitancy MS: Denies joint pain, muscle weakness, cramps, or limitation of movement.  Derm: Denies rash, itching, dry skin Psych: Denies depression, anxiety, memory loss, and confusion Heme: Denies bruising, bleeding, and enlarged lymph nodes.   Physical Exam   BP 110/70 (BP Location: Right Arm, Patient Position: Sitting, Cuff Size: Normal)   Pulse 91   Temp (!) 96.9 F (36.1 C) (Temporal)   Ht '5\' 6"'$  (1.676 m)   Wt 170 lb 3.2 oz (77.2 kg)   SpO2 99%   BMI 27.47 kg/m  General:   Alert and oriented. Pleasant and cooperative. Well-nourished and well-developed.  Head:  Normocephalic and atraumatic. Eyes:  Without icterus Abdomen:  +BS, soft, non-tender and non-distended. No HSM noted. No guarding or rebound. No masses appreciated.  Rectal:  Deferred  Msk:  with kyphosis Extremities:  Without edema. Neurologic:  Alert and  oriented x4;  grossly normal neurologically. Skin:  Intact without significant lesions or rashes. Psych:  Alert and cooperative. Normal mood and affect.  Lab Results  Component Value Date   WBC 5.0 01/12/2022   HGB 15.0 01/12/2022   HCT 40.5 01/12/2022   MCV 92.5 01/12/2022   PLT 229 01/12/2022   Lab Results  Component Value Date   IRON 243 (H) 11/05/2021   TIBC 322 11/05/2021   FERRITIN 24 11/05/2021     Assessment   Edwin Burgess is a 76 y.o. male presenting today in follow-up with a history of GERD, dysphagia, adenomas, and IDA.  GERD: doing well on once daily PPI.   Dysphagia: resolved. Last EGD 05/2021 s/p dilation of stricture.  Adenomas: surveillance due 2027. No concerning lower GI signs/symptoms.  IDA: noted by  Nephrology about 6 months ago. Hgb improved. Ferritin 24 in Feb 2023. On iron once daily. EGD/colonoscopy recent. Recommend capsule study to wrap up IDA evaluation. He is declining at this time. No overt GI bleeding.   He will continue close follow-up with Nephrology. As he is declining further evaluation of IDA, will see him in 1 year or sooner if necessary.   PLAN    PPI daily Continue close follow-up with Nephrology 1 year follow-up Monitor for worsening anemia, overt GI bleeding Recommend capsule when willing   Annitta Needs, PhD, ANP-BC [facility] Gastroenterology

## 2022-02-18 NOTE — Patient Instructions (Signed)
We will see you back in 1 year!  Please call if any dark/tarry stools or bright red blood in stool.  I enjoyed seeing you again today! As you know, I value our relationship and want to provide genuine, compassionate, and quality care. I welcome your feedback. If you receive a survey regarding your visit,  I greatly appreciate you taking time to fill this out. See you next time!  Annitta Needs, PhD, ANP-BC [facility] Gastroenterology

## 2022-03-18 DIAGNOSIS — Z789 Other specified health status: Secondary | ICD-10-CM | POA: Diagnosis not present

## 2022-03-18 DIAGNOSIS — R0981 Nasal congestion: Secondary | ICD-10-CM | POA: Diagnosis not present

## 2022-03-18 DIAGNOSIS — E1165 Type 2 diabetes mellitus with hyperglycemia: Secondary | ICD-10-CM | POA: Diagnosis not present

## 2022-03-18 DIAGNOSIS — Z299 Encounter for prophylactic measures, unspecified: Secondary | ICD-10-CM | POA: Diagnosis not present

## 2022-03-18 DIAGNOSIS — I10 Essential (primary) hypertension: Secondary | ICD-10-CM | POA: Diagnosis not present

## 2022-03-18 DIAGNOSIS — Z6830 Body mass index (BMI) 30.0-30.9, adult: Secondary | ICD-10-CM | POA: Diagnosis not present

## 2022-04-08 ENCOUNTER — Other Ambulatory Visit (HOSPITAL_COMMUNITY)
Admission: RE | Admit: 2022-04-08 | Discharge: 2022-04-08 | Disposition: A | Discharge status: Home / Self Care | Payer: Medicare PPO | Source: Ambulatory Visit | Attending: Nephrology | Admitting: Nephrology

## 2022-04-08 DIAGNOSIS — H43813 Vitreous degeneration, bilateral: Secondary | ICD-10-CM | POA: Diagnosis not present

## 2022-04-08 DIAGNOSIS — N189 Chronic kidney disease, unspecified: Secondary | ICD-10-CM | POA: Diagnosis not present

## 2022-04-08 LAB — CBC
HCT: 39.5 % (ref 39.0–52.0)
Hemoglobin: 14.3 g/dL (ref 13.0–17.0)
MCH: 33 pg (ref 26.0–34.0)
MCHC: 36.2 g/dL — ABNORMAL HIGH (ref 30.0–36.0)
MCV: 91.2 fL (ref 80.0–100.0)
Platelets: 207 10*3/uL (ref 150–400)
RBC: 4.33 MIL/uL (ref 4.22–5.81)
RDW: 12.6 % (ref 11.5–15.5)
WBC: 5 10*3/uL (ref 4.0–10.5)
nRBC: 0 % (ref 0.0–0.2)

## 2022-04-08 LAB — PROTEIN / CREATININE RATIO, URINE
Creatinine, Urine: 65.91 mg/dL
Protein Creatinine Ratio: 0.2 mg/mg{Cre} — ABNORMAL HIGH (ref 0.00–0.15)
Total Protein, Urine: 13 mg/dL

## 2022-04-08 LAB — RENAL FUNCTION PANEL
Albumin: 4 g/dL (ref 3.5–5.0)
Anion gap: 4 — ABNORMAL LOW (ref 5–15)
BUN: 15 mg/dL (ref 8–23)
CO2: 24 mmol/L (ref 22–32)
Calcium: 9.8 mg/dL (ref 8.9–10.3)
Chloride: 108 mmol/L (ref 98–111)
Creatinine, Ser: 1.4 mg/dL — ABNORMAL HIGH (ref 0.61–1.24)
GFR, Estimated: 52 mL/min — ABNORMAL LOW (ref >60)
Glucose, Bld: 223 mg/dL — ABNORMAL HIGH (ref 70–99)
Phosphorus: 3 mg/dL (ref 2.5–4.6)
Potassium: 4 mmol/L (ref 3.5–5.1)
Sodium: 136 mmol/L (ref 135–145)

## 2022-04-15 DIAGNOSIS — E1122 Type 2 diabetes mellitus with diabetic chronic kidney disease: Secondary | ICD-10-CM | POA: Diagnosis not present

## 2022-04-15 DIAGNOSIS — I129 Hypertensive chronic kidney disease with stage 1 through stage 4 chronic kidney disease, or unspecified chronic kidney disease: Secondary | ICD-10-CM | POA: Diagnosis not present

## 2022-04-15 DIAGNOSIS — R809 Proteinuria, unspecified: Secondary | ICD-10-CM | POA: Diagnosis not present

## 2022-04-15 DIAGNOSIS — E1129 Type 2 diabetes mellitus with other diabetic kidney complication: Secondary | ICD-10-CM | POA: Diagnosis not present

## 2022-04-15 DIAGNOSIS — N189 Chronic kidney disease, unspecified: Secondary | ICD-10-CM | POA: Diagnosis not present

## 2022-04-15 DIAGNOSIS — N200 Calculus of kidney: Secondary | ICD-10-CM | POA: Diagnosis not present

## 2022-04-22 DIAGNOSIS — B351 Tinea unguium: Secondary | ICD-10-CM | POA: Diagnosis not present

## 2022-04-22 DIAGNOSIS — E1142 Type 2 diabetes mellitus with diabetic polyneuropathy: Secondary | ICD-10-CM | POA: Diagnosis not present

## 2022-04-22 DIAGNOSIS — L84 Corns and callosities: Secondary | ICD-10-CM | POA: Diagnosis not present

## 2022-04-22 DIAGNOSIS — M79676 Pain in unspecified toe(s): Secondary | ICD-10-CM | POA: Diagnosis not present

## 2022-05-26 DIAGNOSIS — R809 Proteinuria, unspecified: Secondary | ICD-10-CM | POA: Diagnosis not present

## 2022-05-26 DIAGNOSIS — B3749 Other urogenital candidiasis: Secondary | ICD-10-CM | POA: Diagnosis not present

## 2022-05-26 DIAGNOSIS — N189 Chronic kidney disease, unspecified: Secondary | ICD-10-CM | POA: Diagnosis not present

## 2022-05-26 DIAGNOSIS — E1122 Type 2 diabetes mellitus with diabetic chronic kidney disease: Secondary | ICD-10-CM | POA: Diagnosis not present

## 2022-05-26 DIAGNOSIS — I129 Hypertensive chronic kidney disease with stage 1 through stage 4 chronic kidney disease, or unspecified chronic kidney disease: Secondary | ICD-10-CM | POA: Diagnosis not present

## 2022-05-26 DIAGNOSIS — E1129 Type 2 diabetes mellitus with other diabetic kidney complication: Secondary | ICD-10-CM | POA: Diagnosis not present

## 2022-06-23 DIAGNOSIS — Z0000 Encounter for general adult medical examination without abnormal findings: Secondary | ICD-10-CM | POA: Diagnosis not present

## 2022-06-23 DIAGNOSIS — Z6832 Body mass index (BMI) 32.0-32.9, adult: Secondary | ICD-10-CM | POA: Diagnosis not present

## 2022-06-23 DIAGNOSIS — I10 Essential (primary) hypertension: Secondary | ICD-10-CM | POA: Diagnosis not present

## 2022-06-23 DIAGNOSIS — E1165 Type 2 diabetes mellitus with hyperglycemia: Secondary | ICD-10-CM | POA: Diagnosis not present

## 2022-06-23 DIAGNOSIS — Z299 Encounter for prophylactic measures, unspecified: Secondary | ICD-10-CM | POA: Diagnosis not present

## 2022-06-23 DIAGNOSIS — E7800 Pure hypercholesterolemia, unspecified: Secondary | ICD-10-CM | POA: Diagnosis not present

## 2022-07-01 DIAGNOSIS — M79676 Pain in unspecified toe(s): Secondary | ICD-10-CM | POA: Diagnosis not present

## 2022-07-01 DIAGNOSIS — E1142 Type 2 diabetes mellitus with diabetic polyneuropathy: Secondary | ICD-10-CM | POA: Diagnosis not present

## 2022-07-01 DIAGNOSIS — L84 Corns and callosities: Secondary | ICD-10-CM | POA: Diagnosis not present

## 2022-07-01 DIAGNOSIS — B351 Tinea unguium: Secondary | ICD-10-CM | POA: Diagnosis not present

## 2022-08-23 ENCOUNTER — Other Ambulatory Visit (HOSPITAL_COMMUNITY)
Admission: RE | Admit: 2022-08-23 | Discharge: 2022-08-23 | Disposition: A | Discharge status: Home / Self Care | Payer: Medicare PPO | Source: Ambulatory Visit | Attending: Nephrology | Admitting: Nephrology

## 2022-08-23 DIAGNOSIS — N189 Chronic kidney disease, unspecified: Secondary | ICD-10-CM | POA: Diagnosis not present

## 2022-08-23 LAB — CBC
HCT: 36.4 % — ABNORMAL LOW (ref 39.0–52.0)
Hemoglobin: 12.9 g/dL — ABNORMAL LOW (ref 13.0–17.0)
MCH: 33.2 pg (ref 26.0–34.0)
MCHC: 35.4 g/dL (ref 30.0–36.0)
MCV: 93.6 fL (ref 80.0–100.0)
Platelets: 222 10*3/uL (ref 150–400)
RBC: 3.89 MIL/uL — ABNORMAL LOW (ref 4.22–5.81)
RDW: 12.7 % (ref 11.5–15.5)
WBC: 5.4 10*3/uL (ref 4.0–10.5)
nRBC: 0 % (ref 0.0–0.2)

## 2022-08-23 LAB — RENAL FUNCTION PANEL
Albumin: 4 g/dL (ref 3.5–5.0)
Anion gap: 7 (ref 5–15)
BUN: 12 mg/dL (ref 8–23)
CO2: 24 mmol/L (ref 22–32)
Calcium: 9.6 mg/dL (ref 8.9–10.3)
Chloride: 103 mmol/L (ref 98–111)
Creatinine, Ser: 1.17 mg/dL (ref 0.61–1.24)
GFR, Estimated: >60 mL/min (ref >60)
Glucose, Bld: 324 mg/dL — ABNORMAL HIGH (ref 70–99)
Phosphorus: 2.3 mg/dL — ABNORMAL LOW (ref 2.5–4.6)
Potassium: 4.1 mmol/L (ref 3.5–5.1)
Sodium: 134 mmol/L — ABNORMAL LOW (ref 135–145)

## 2022-08-23 LAB — PROTEIN / CREATININE RATIO, URINE
Creatinine, Urine: 82.09 mg/dL
Protein Creatinine Ratio: 0.26 mg/mg{Cre} — ABNORMAL HIGH (ref 0.00–0.15)
Total Protein, Urine: 21 mg/dL

## 2022-09-09 DIAGNOSIS — M79676 Pain in unspecified toe(s): Secondary | ICD-10-CM | POA: Diagnosis not present

## 2022-09-09 DIAGNOSIS — L84 Corns and callosities: Secondary | ICD-10-CM | POA: Diagnosis not present

## 2022-09-09 DIAGNOSIS — E1142 Type 2 diabetes mellitus with diabetic polyneuropathy: Secondary | ICD-10-CM | POA: Diagnosis not present

## 2022-09-09 DIAGNOSIS — B351 Tinea unguium: Secondary | ICD-10-CM | POA: Diagnosis not present

## 2022-09-11 DIAGNOSIS — E1129 Type 2 diabetes mellitus with other diabetic kidney complication: Secondary | ICD-10-CM | POA: Diagnosis not present

## 2022-09-11 DIAGNOSIS — E1122 Type 2 diabetes mellitus with diabetic chronic kidney disease: Secondary | ICD-10-CM | POA: Diagnosis not present

## 2022-09-11 DIAGNOSIS — I129 Hypertensive chronic kidney disease with stage 1 through stage 4 chronic kidney disease, or unspecified chronic kidney disease: Secondary | ICD-10-CM | POA: Diagnosis not present

## 2022-09-11 DIAGNOSIS — E876 Hypokalemia: Secondary | ICD-10-CM | POA: Diagnosis not present

## 2022-09-11 DIAGNOSIS — R809 Proteinuria, unspecified: Secondary | ICD-10-CM | POA: Diagnosis not present

## 2022-09-11 DIAGNOSIS — N189 Chronic kidney disease, unspecified: Secondary | ICD-10-CM | POA: Diagnosis not present

## 2022-09-11 DIAGNOSIS — E871 Hypo-osmolality and hyponatremia: Secondary | ICD-10-CM | POA: Diagnosis not present

## 2022-09-28 DIAGNOSIS — Z713 Dietary counseling and surveillance: Secondary | ICD-10-CM | POA: Diagnosis not present

## 2022-09-28 DIAGNOSIS — E1165 Type 2 diabetes mellitus with hyperglycemia: Secondary | ICD-10-CM | POA: Diagnosis not present

## 2022-09-28 DIAGNOSIS — Z6832 Body mass index (BMI) 32.0-32.9, adult: Secondary | ICD-10-CM | POA: Diagnosis not present

## 2022-09-28 DIAGNOSIS — I10 Essential (primary) hypertension: Secondary | ICD-10-CM | POA: Diagnosis not present

## 2022-09-28 DIAGNOSIS — Z299 Encounter for prophylactic measures, unspecified: Secondary | ICD-10-CM | POA: Diagnosis not present

## 2022-11-15 DIAGNOSIS — Z299 Encounter for prophylactic measures, unspecified: Secondary | ICD-10-CM | POA: Diagnosis not present

## 2022-11-15 DIAGNOSIS — I10 Essential (primary) hypertension: Secondary | ICD-10-CM | POA: Diagnosis not present

## 2022-11-15 DIAGNOSIS — E1165 Type 2 diabetes mellitus with hyperglycemia: Secondary | ICD-10-CM | POA: Diagnosis not present

## 2022-11-15 DIAGNOSIS — K5900 Constipation, unspecified: Secondary | ICD-10-CM | POA: Diagnosis not present

## 2022-11-18 DIAGNOSIS — L84 Corns and callosities: Secondary | ICD-10-CM | POA: Diagnosis not present

## 2022-11-18 DIAGNOSIS — M79676 Pain in unspecified toe(s): Secondary | ICD-10-CM | POA: Diagnosis not present

## 2022-11-18 DIAGNOSIS — E1142 Type 2 diabetes mellitus with diabetic polyneuropathy: Secondary | ICD-10-CM | POA: Diagnosis not present

## 2022-11-18 DIAGNOSIS — B351 Tinea unguium: Secondary | ICD-10-CM | POA: Diagnosis not present

## 2022-12-07 ENCOUNTER — Other Ambulatory Visit (HOSPITAL_COMMUNITY)
Admission: EM | Admit: 2022-12-07 | Discharge: 2022-12-07 | Disposition: A | Discharge status: Home / Self Care | Payer: Medicare PPO | Source: Ambulatory Visit | Attending: Nephrology | Admitting: Nephrology

## 2022-12-07 DIAGNOSIS — N189 Chronic kidney disease, unspecified: Secondary | ICD-10-CM | POA: Diagnosis not present

## 2022-12-07 DIAGNOSIS — E876 Hypokalemia: Secondary | ICD-10-CM | POA: Diagnosis not present

## 2022-12-07 DIAGNOSIS — E871 Hypo-osmolality and hyponatremia: Secondary | ICD-10-CM | POA: Diagnosis not present

## 2022-12-07 DIAGNOSIS — E1122 Type 2 diabetes mellitus with diabetic chronic kidney disease: Secondary | ICD-10-CM | POA: Diagnosis not present

## 2022-12-07 DIAGNOSIS — E1129 Type 2 diabetes mellitus with other diabetic kidney complication: Secondary | ICD-10-CM | POA: Diagnosis not present

## 2022-12-07 DIAGNOSIS — I129 Hypertensive chronic kidney disease with stage 1 through stage 4 chronic kidney disease, or unspecified chronic kidney disease: Secondary | ICD-10-CM | POA: Insufficient documentation

## 2022-12-07 DIAGNOSIS — R809 Proteinuria, unspecified: Secondary | ICD-10-CM | POA: Diagnosis not present

## 2022-12-07 LAB — RENAL FUNCTION PANEL
Albumin: 4 g/dL (ref 3.5–5.0)
Anion gap: 6 (ref 5–15)
BUN: 7 mg/dL — ABNORMAL LOW (ref 8–23)
CO2: 26 mmol/L (ref 22–32)
Calcium: 9.8 mg/dL (ref 8.9–10.3)
Chloride: 102 mmol/L (ref 98–111)
Creatinine, Ser: 1.12 mg/dL (ref 0.61–1.24)
GFR, Estimated: >60 mL/min (ref >60)
Glucose, Bld: 121 mg/dL — ABNORMAL HIGH (ref 70–99)
Phosphorus: 3 mg/dL (ref 2.5–4.6)
Potassium: 3.9 mmol/L (ref 3.5–5.1)
Sodium: 134 mmol/L — ABNORMAL LOW (ref 135–145)

## 2022-12-07 LAB — CBC WITH DIFFERENTIAL/PLATELET
Abs Immature Granulocytes: 0.02 10*3/uL (ref 0.00–0.07)
Basophils Absolute: 0 10*3/uL (ref 0.0–0.1)
Basophils Relative: 1 %
Eosinophils Absolute: 0.1 10*3/uL (ref 0.0–0.5)
Eosinophils Relative: 2 %
HCT: 40.7 % (ref 39.0–52.0)
Hemoglobin: 14.6 g/dL (ref 13.0–17.0)
Immature Granulocytes: 0 %
Lymphocytes Relative: 22 %
Lymphs Abs: 1.3 10*3/uL (ref 0.7–4.0)
MCH: 33.3 pg (ref 26.0–34.0)
MCHC: 35.9 g/dL (ref 30.0–36.0)
MCV: 92.9 fL (ref 80.0–100.0)
Monocytes Absolute: 0.6 10*3/uL (ref 0.1–1.0)
Monocytes Relative: 10 %
Neutro Abs: 3.7 10*3/uL (ref 1.7–7.7)
Neutrophils Relative %: 65 %
Platelets: 229 10*3/uL (ref 150–400)
RBC: 4.38 MIL/uL (ref 4.22–5.81)
RDW: 12.3 % (ref 11.5–15.5)
WBC: 5.7 10*3/uL (ref 4.0–10.5)
nRBC: 0 % (ref 0.0–0.2)

## 2022-12-07 LAB — PROTEIN / CREATININE RATIO, URINE
Creatinine, Urine: 121 mg/dL
Protein Creatinine Ratio: 0.17 mg/mg{Cre} — ABNORMAL HIGH (ref 0.00–0.15)
Total Protein, Urine: 20 mg/dL

## 2022-12-15 DIAGNOSIS — N200 Calculus of kidney: Secondary | ICD-10-CM | POA: Diagnosis not present

## 2022-12-15 DIAGNOSIS — I129 Hypertensive chronic kidney disease with stage 1 through stage 4 chronic kidney disease, or unspecified chronic kidney disease: Secondary | ICD-10-CM | POA: Diagnosis not present

## 2022-12-15 DIAGNOSIS — E871 Hypo-osmolality and hyponatremia: Secondary | ICD-10-CM | POA: Diagnosis not present

## 2022-12-15 DIAGNOSIS — E1129 Type 2 diabetes mellitus with other diabetic kidney complication: Secondary | ICD-10-CM | POA: Diagnosis not present

## 2022-12-15 DIAGNOSIS — N189 Chronic kidney disease, unspecified: Secondary | ICD-10-CM | POA: Diagnosis not present

## 2022-12-15 DIAGNOSIS — E1122 Type 2 diabetes mellitus with diabetic chronic kidney disease: Secondary | ICD-10-CM | POA: Diagnosis not present

## 2022-12-15 DIAGNOSIS — R809 Proteinuria, unspecified: Secondary | ICD-10-CM | POA: Diagnosis not present

## 2023-01-04 DIAGNOSIS — I10 Essential (primary) hypertension: Secondary | ICD-10-CM | POA: Diagnosis not present

## 2023-01-04 DIAGNOSIS — Z299 Encounter for prophylactic measures, unspecified: Secondary | ICD-10-CM | POA: Diagnosis not present

## 2023-01-04 DIAGNOSIS — E1165 Type 2 diabetes mellitus with hyperglycemia: Secondary | ICD-10-CM | POA: Diagnosis not present

## 2023-01-20 ENCOUNTER — Encounter: Payer: Self-pay | Admitting: Gastroenterology

## 2023-01-27 DIAGNOSIS — B351 Tinea unguium: Secondary | ICD-10-CM | POA: Diagnosis not present

## 2023-01-27 DIAGNOSIS — E1142 Type 2 diabetes mellitus with diabetic polyneuropathy: Secondary | ICD-10-CM | POA: Diagnosis not present

## 2023-01-27 DIAGNOSIS — L84 Corns and callosities: Secondary | ICD-10-CM | POA: Diagnosis not present

## 2023-01-27 DIAGNOSIS — M79676 Pain in unspecified toe(s): Secondary | ICD-10-CM | POA: Diagnosis not present

## 2023-02-16 DIAGNOSIS — Z1331 Encounter for screening for depression: Secondary | ICD-10-CM | POA: Diagnosis not present

## 2023-02-16 DIAGNOSIS — E1165 Type 2 diabetes mellitus with hyperglycemia: Secondary | ICD-10-CM | POA: Diagnosis not present

## 2023-02-16 DIAGNOSIS — Z7189 Other specified counseling: Secondary | ICD-10-CM | POA: Diagnosis not present

## 2023-02-16 DIAGNOSIS — Z0000 Encounter for general adult medical examination without abnormal findings: Secondary | ICD-10-CM | POA: Diagnosis not present

## 2023-02-16 DIAGNOSIS — G4730 Sleep apnea, unspecified: Secondary | ICD-10-CM | POA: Diagnosis not present

## 2023-02-16 DIAGNOSIS — Z299 Encounter for prophylactic measures, unspecified: Secondary | ICD-10-CM | POA: Diagnosis not present

## 2023-02-16 DIAGNOSIS — Z1339 Encounter for screening examination for other mental health and behavioral disorders: Secondary | ICD-10-CM | POA: Diagnosis not present

## 2023-02-16 DIAGNOSIS — I10 Essential (primary) hypertension: Secondary | ICD-10-CM | POA: Diagnosis not present

## 2023-02-24 ENCOUNTER — Ambulatory Visit (INDEPENDENT_AMBULATORY_CARE_PROVIDER_SITE_OTHER): Payer: Medicare PPO | Admitting: Gastroenterology

## 2023-02-24 ENCOUNTER — Encounter: Payer: Self-pay | Admitting: Gastroenterology

## 2023-02-24 VITALS — BP 126/76 | HR 75 | Temp 97.7°F | Ht 66.0 in | Wt 171.1 lb

## 2023-02-24 DIAGNOSIS — K219 Gastro-esophageal reflux disease without esophagitis: Secondary | ICD-10-CM | POA: Diagnosis not present

## 2023-02-24 NOTE — Patient Instructions (Signed)
We will see you back in 1 year!  Please message me on MyChart with any concerns!  Continue pantoprazole once daily. You can take pepcid as needed in evenings if any breakthrough reflux.   I enjoyed seeing you again today! I value our relationship and want to provide genuine, compassionate, and quality care. You may receive a survey regarding your visit with me, and I welcome your feedback! Thanks so much for taking the time to complete this. I look forward to seeing you again.      Gelene Mink, PhD, ANP-BC [facility] Gastroenterology

## 2023-02-24 NOTE — Progress Notes (Signed)
Gastroenterology Office Note     Primary Care Physician:  Kirstie Peri, MD  Primary Gastroenterologist: Dr. Marletta Lor    Chief Complaint   Chief Complaint  Patient presents with   Follow-up    Follow up on GERD, swallowing and anemia     History of Present Illness   Edwin Burgess is a 77 y.o. male presenting today in follow-up with a history of GERD and dysphagia, undergoing EGD Sept 2022 with short peptic esophageal stricture s/p dilation, small hiatal hernia, gastric erosions s/p biopsy, multiple bulbar ulcers and erosions. Gastric mucosa with hyperemia. Negative H.pylori. History of adenomas with surveillance due in 2027. IDA noted early in 2023 but declining capsule study.     Recent Hgb 14.6. Gas/belching. Constipation. Stool softener relieves constipation. Started while on Rybelsus. Ferrous sulfate once daily. Once in a great while will have throat burning after dinner, very seldom. Will take Rolaids with relief. Pantoprazole daily.   He feels symptoms on Rybelsus are manageable and just wanted to mention this.   Past Medical History:  Diagnosis Date   Asthma    DM (diabetes mellitus) (HCC)    GERD (gastroesophageal reflux disease)    Hyperlipidemia    Hypertension    Personal history of colonic polyps    Sleep apnea     Past Surgical History:  Procedure Laterality Date   BIOPSY  02/01/2020   Procedure: BIOPSY;  Surgeon: West Bali, MD;  [facility];  Service: Endoscopy;;   BIOPSY  06/17/2021   Procedure: BIOPSY;  Surgeon: Corbin Ade, MD;  [facility];  Service: Endoscopy;;   CATARACT EXTRACTION, BILATERAL     COLONOSCOPY  04/30/2006   dr.fleishman/mild diverticulosis. recommended to have next TCS 2012.   COLONOSCOPY  06/15/2012   ZOX:WRUEA internal hemorrhoids/ Mild diverticulosis/Eight sessile polyps measuring 2-6 mm (tubular adenomas). Surveillance in 5 years due to multiple adenomas   COLONOSCOPY N/A 08/26/2017   One 2  mm polyps in ascending colon, seven 3-6 mm polyps in sigmoid, transverse, and cecum, diverticulosis in rectosigmoid, descending, and cecum, external and internal hemorrhoids. (seven simple adenomas and one hyperplastic polyp). 3 year surveillance    COLONOSCOPY WITH PROPOFOL N/A 09/30/2020   Non-bleeding internal hemorrhoids. Diverticulosis in the sigmoid colon and in the descending colon. One 8 mm polyp in the ascending colon, removed (tubular adenoma).. Two 3 to 4 mm polyps in the sigmoid colon, removed, Resected and retrieved (tubular adenoma). There was significant looping of the colon.   ESOPHAGOGASTRODUODENOSCOPY  07/06/2010   tight schatzki ring s/p dilation/small hiatal hernia   ESOPHAGOGASTRODUODENOSCOPY N/A 02/01/2020    one benign-appearing intrinsic mild stenosis s/p dilation, mild gastritis, large non-bleeding duodenal diverticulum. Negative H.pylori.    ESOPHAGOGASTRODUODENOSCOPY N/A 06/17/2021   short peptic esophageal stricture s/p dilation, small hiatal hernia, gastric erosions s/p biopsy, multiple bulbar ulcers and erosions. Gastric mucosa with hyperemia. Negative H.pylori.   MALONEY DILATION N/A 06/17/2021   Procedure: Elease Hashimoto DILATION;  Surgeon: Corbin Ade, MD;  [facility];  Service: Endoscopy;  Laterality: N/A;  EGD with [facility] Dilation   POLYPECTOMY  08/26/2017   Procedure: POLYPECTOMY;  Surgeon: West Bali, MD;  [facility];  Service: Endoscopy;;  cecal; transverse colon; ascending colon    POLYPECTOMY  09/30/2020   Procedure: POLYPECTOMY;  Surgeon: Lanelle Bal, DO;  [facility];  Service: Endoscopy;;   SAVORY DILATION N/A 02/01/2020   Procedure: SAVORY DILATION;  Surgeon: Darrick Penna,  Darleene Cleaver, MD;  [facility];  Service: Endoscopy;  Laterality: N/A;    Current Outpatient Medications  Medication Sig Dispense Refill   docusate sodium (COLACE) 100 MG capsule Take 100 mg by mouth 2 (two) times daily.     ferrous  sulfate 325 (65 FE) MG tablet Take 325 mg by mouth daily with breakfast.     lisinopril (ZESTRIL) 2.5 MG tablet Take 1 tablet (2.5 mg total) by mouth daily. Hold onto follow-up with PCP.     montelukast (SINGULAIR) 10 MG tablet Take 10 mg by mouth at bedtime.     pantoprazole (PROTONIX) 40 MG tablet Take 1 tablet (40 mg total) by mouth 2 (two) times daily. (Patient taking differently: Take 40 mg by mouth daily.) 60 tablet 3   rosuvastatin (CRESTOR) 10 MG tablet Take 10 mg by mouth every evening.      Semaglutide (RYBELSUS) 7 MG TABS Take 7 mg by mouth.     theophylline (THEODUR) 300 MG 12 hr tablet Take 300 mg by mouth 2 (two) times daily.     albuterol (PROVENTIL HFA;VENTOLIN HFA) 108 (90 BASE) MCG/ACT inhaler Inhale 2 puffs into the lungs as needed for wheezing or shortness of breath. Asthma (Patient not taking: Reported on 02/24/2023)     FARXIGA 5 MG TABS tablet Take 5 mg by mouth daily. (Patient not taking: Reported on 02/24/2023)     metFORMIN (GLUCOPHAGE) 500 MG tablet Take 500 mg by mouth daily with breakfast. (Patient not taking: Reported on 02/24/2023)     No current facility-administered medications for this visit.    Allergies as of 02/24/2023 - Review Complete 02/24/2023  Allergen Reaction Noted   Amoxicillin Nausea And Vomiting 06/16/2021   Tetracycline Itching     Family History  Problem Relation Age of Onset   Colon cancer Maternal Grandfather 15   Breast cancer Maternal Aunt     Social History   Socioeconomic History   Marital status: Married    Spouse name: Not on file   Number of children: 2   Years of education: Not on file   Highest education level: Not on file  Occupational History   Occupation: Geophysicist/field seismologist   Occupation: retired Engineer, site  Tobacco Use   Smoking status: Never   Smokeless tobacco: Never  Building services engineer Use: Never used  Substance and Sexual Activity   Alcohol use: No   Drug use: No   Sexual activity: Yes  Other Topics  Concern   Not on file  Social History Narrative   Not on file   Social Determinants of Health   Financial Resource Strain: Not on file  Food Insecurity: Not on file  Transportation Needs: Not on file  Physical Activity: Not on file  Stress: Not on file  Social Connections: Not on file  Intimate Partner Violence: Not on file     Review of Systems   Gen: Denies any fever, chills, fatigue, weight loss, lack of appetite.  CV: Denies chest pain, heart palpitations, peripheral edema, syncope.  Resp: Denies shortness of breath at rest or with exertion. Denies wheezing or cough.  GI: Denies dysphagia or odynophagia. Denies jaundice, hematemesis, fecal incontinence. GU : Denies urinary burning, urinary frequency, urinary hesitancy MS: Denies joint pain, muscle weakness, cramps, or limitation of movement.  Derm: Denies rash, itching, dry skin Psych: Denies depression, anxiety, memory loss, and confusion Heme: Denies bruising, bleeding, and enlarged lymph nodes.   Physical Exam   BP 126/76  Pulse 75   Temp 97.7 F (36.5 C)   Ht 5\' 6"  (1.676 m)   Wt 171 lb 1.6 oz (77.6 kg)   BMI 27.62 kg/m  General:   Alert and oriented. Pleasant and cooperative. Well-nourished and well-developed.  Head:  Normocephalic and atraumatic. Eyes:  Without icterus Abdomen:  +BS, soft, non-tender and non-distended. No HSM noted. No guarding or rebound. No masses appreciated.  Rectal:  Deferred  Msk:  Symmetrical without gross deformities. Normal posture. Extremities:  Without edema. Neurologic:  Alert and  oriented x4;  grossly normal neurologically. Skin:  Intact without significant lesions or rashes. Psych:  Alert and cooperative. Normal mood and affect.   Assessment   Edwin Burgess is a 77 y.o. male presenting today in follow-up with a history of GERD, dysphagia s/p dilation in past, adenomas with surveillance due in 2027, IDA in 2023 for yearly visit.  GERD: overall well-controlled with  pantoprazole once daily. Rarely will have breakthrough related to food choices, resolved by antacid. No dysphagia or alarm signs/symptoms.  IDA: noted in past. EGD and colonoscopy on file but declined capsule study. Continues with oral iron once daily. Wants to hold off on further evaluation unless any worsening. Hgb 14.6 recently. Continues to be managed by Nephrology.   Notes symptoms of constipation, belching while no Rybelsus, likely as side effect. He notes this when starting the medication but does not want to adjust this as blood sugars well-controlled. He notes symptoms are manageable.   PLAN    Pantoprazole once daily Follow-up in 1 year Continue close follow-up with Nephrology   Gelene Mink, PhD, ANP-BC [facility]

## 2023-03-22 ENCOUNTER — Other Ambulatory Visit (HOSPITAL_COMMUNITY)
Admission: RE | Admit: 2023-03-22 | Discharge: 2023-03-22 | Disposition: A | Discharge status: Home / Self Care | Payer: Medicare PPO | Source: Ambulatory Visit | Attending: Nephrology | Admitting: Nephrology

## 2023-03-22 DIAGNOSIS — R809 Proteinuria, unspecified: Secondary | ICD-10-CM | POA: Insufficient documentation

## 2023-03-22 DIAGNOSIS — E871 Hypo-osmolality and hyponatremia: Secondary | ICD-10-CM | POA: Diagnosis not present

## 2023-03-22 DIAGNOSIS — I129 Hypertensive chronic kidney disease with stage 1 through stage 4 chronic kidney disease, or unspecified chronic kidney disease: Secondary | ICD-10-CM | POA: Diagnosis not present

## 2023-03-22 DIAGNOSIS — N189 Chronic kidney disease, unspecified: Secondary | ICD-10-CM | POA: Insufficient documentation

## 2023-03-22 DIAGNOSIS — E1129 Type 2 diabetes mellitus with other diabetic kidney complication: Secondary | ICD-10-CM | POA: Insufficient documentation

## 2023-03-22 DIAGNOSIS — E1122 Type 2 diabetes mellitus with diabetic chronic kidney disease: Secondary | ICD-10-CM | POA: Insufficient documentation

## 2023-03-22 DIAGNOSIS — N200 Calculus of kidney: Secondary | ICD-10-CM | POA: Insufficient documentation

## 2023-03-22 LAB — RENAL FUNCTION PANEL
Albumin: 4.3 g/dL (ref 3.5–5.0)
Anion gap: 8 (ref 5–15)
BUN: 13 mg/dL (ref 8–23)
CO2: 25 mmol/L (ref 22–32)
Calcium: 10 mg/dL (ref 8.9–10.3)
Chloride: 103 mmol/L (ref 98–111)
Creatinine, Ser: 1.16 mg/dL (ref 0.61–1.24)
GFR, Estimated: >60 mL/min (ref >60)
Glucose, Bld: 113 mg/dL — ABNORMAL HIGH (ref 70–99)
Phosphorus: 2.8 mg/dL (ref 2.5–4.6)
Potassium: 4.3 mmol/L (ref 3.5–5.1)
Sodium: 136 mmol/L (ref 135–145)

## 2023-03-22 LAB — PROTEIN / CREATININE RATIO, URINE
Creatinine, Urine: 170 mg/dL
Protein Creatinine Ratio: 0.12 mg/mg{Cre} (ref 0.00–0.15)
Total Protein, Urine: 20 mg/dL

## 2023-03-22 LAB — CORTISOL: Cortisol, Plasma: 6.6 ug/dL

## 2023-03-22 LAB — CBC
HCT: 42.6 % (ref 39.0–52.0)
Hemoglobin: 15.3 g/dL (ref 13.0–17.0)
MCH: 32.9 pg (ref 26.0–34.0)
MCHC: 35.9 g/dL (ref 30.0–36.0)
MCV: 91.6 fL (ref 80.0–100.0)
Platelets: 244 10*3/uL (ref 150–400)
RBC: 4.65 MIL/uL (ref 4.22–5.81)
RDW: 12.1 % (ref 11.5–15.5)
WBC: 6.4 10*3/uL (ref 4.0–10.5)
nRBC: 0 % (ref 0.0–0.2)

## 2023-03-22 LAB — TSH: TSH: 2.384 u[IU]/mL (ref 0.350–4.500)

## 2023-03-22 LAB — T4, FREE: Free T4: 0.79 ng/dL (ref 0.61–1.12)

## 2023-03-24 DIAGNOSIS — E1129 Type 2 diabetes mellitus with other diabetic kidney complication: Secondary | ICD-10-CM | POA: Diagnosis not present

## 2023-03-24 DIAGNOSIS — E1122 Type 2 diabetes mellitus with diabetic chronic kidney disease: Secondary | ICD-10-CM | POA: Diagnosis not present

## 2023-03-24 DIAGNOSIS — N182 Chronic kidney disease, stage 2 (mild): Secondary | ICD-10-CM | POA: Diagnosis not present

## 2023-03-24 DIAGNOSIS — R809 Proteinuria, unspecified: Secondary | ICD-10-CM | POA: Diagnosis not present

## 2023-04-05 IMAGING — US US RENAL
1 series · 14 of 25 positions shown · non-contrast
Comparison: None.

CLINICAL DATA: Acute kidney injury

EXAM:
RENAL / URINARY TRACT ULTRASOUND COMPLETE

[Series 1: us renal · 14 of 60 slices shown]
[im 1/60]
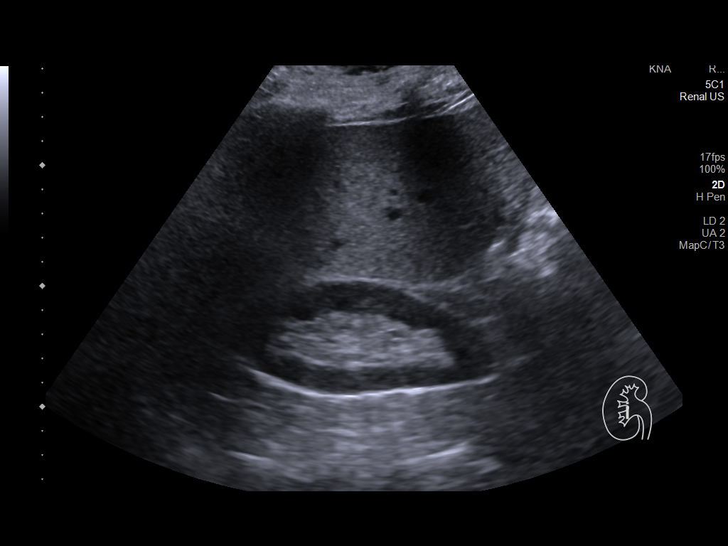
[im 5/60]
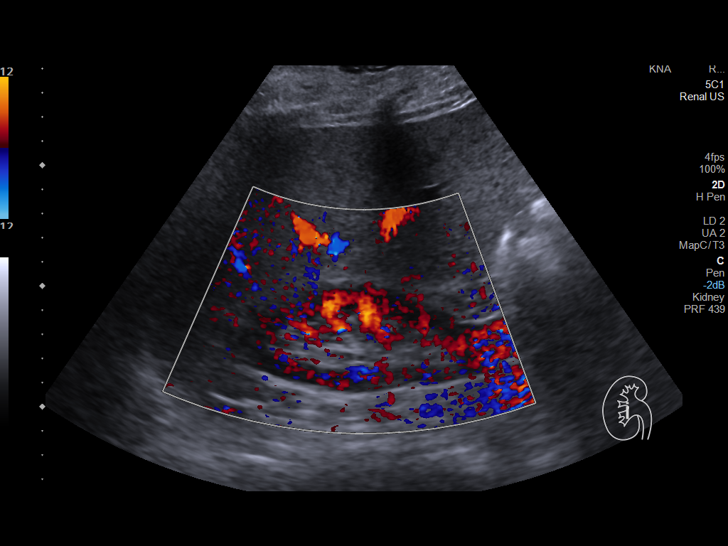
[im 10/60]
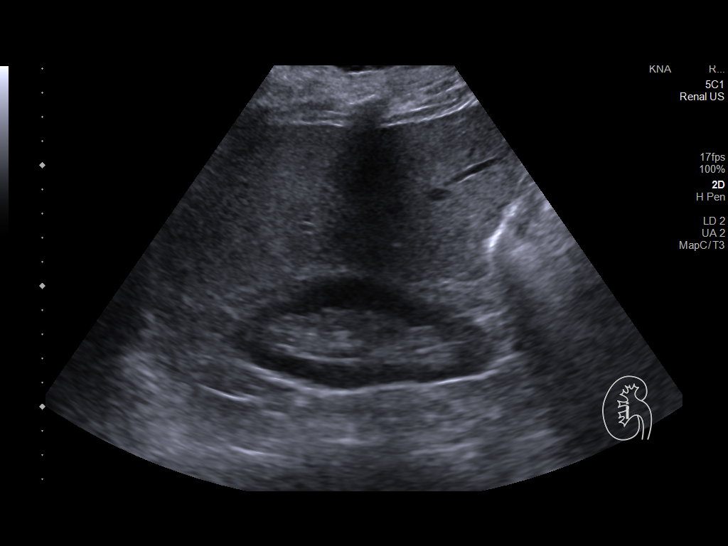
[im 15/60]
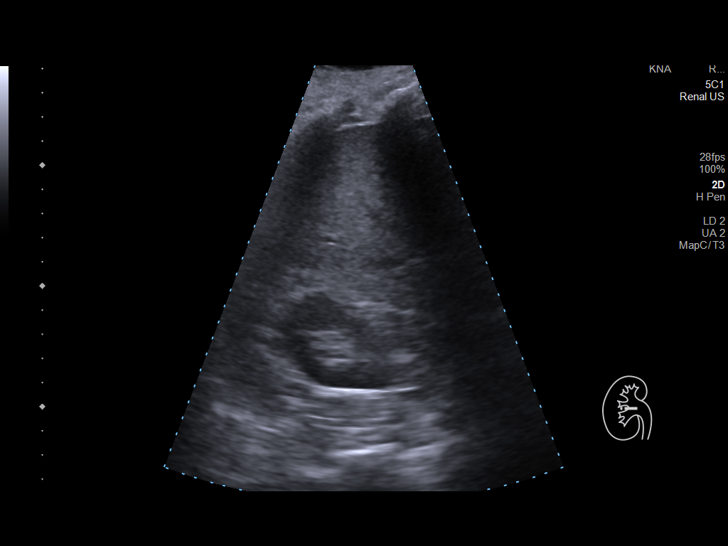
[im 20/60]
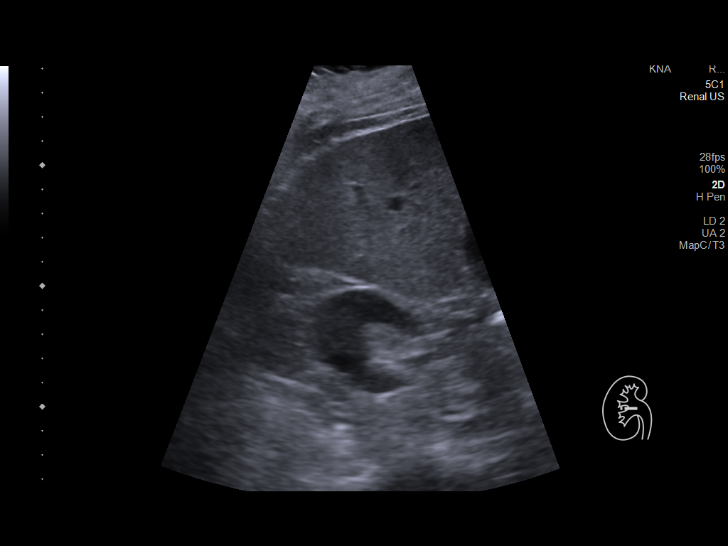
[im 23/60]
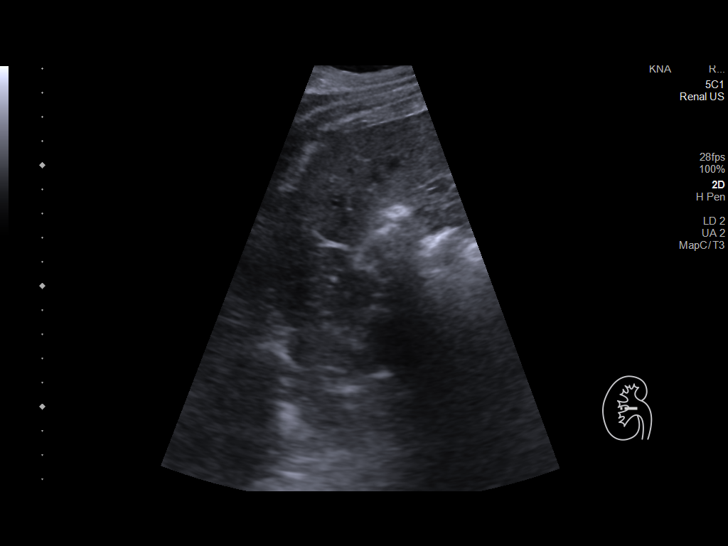
[im 28/60]
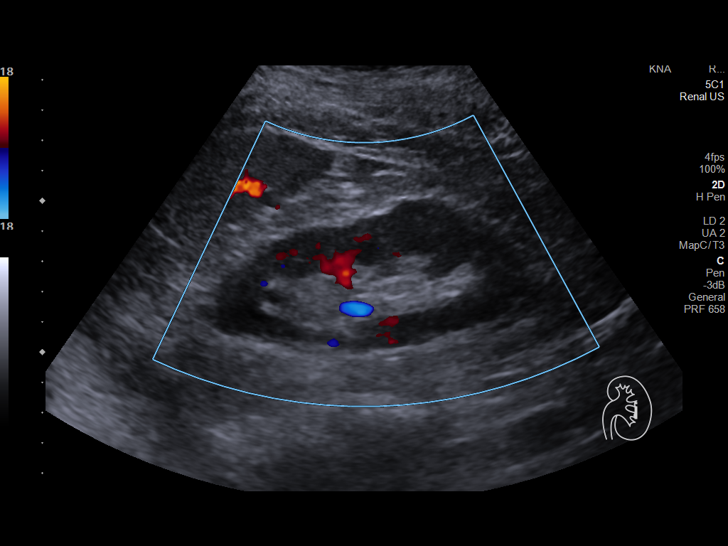
[im 32/60]
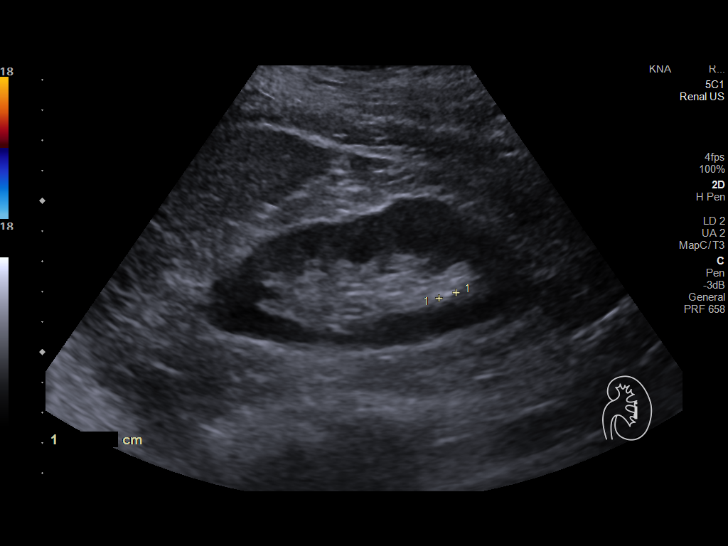
[im 37/60]
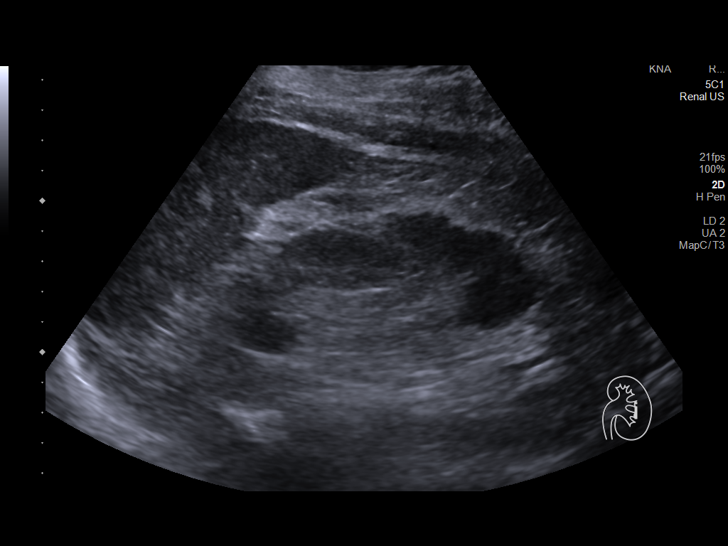
[im 40/60]
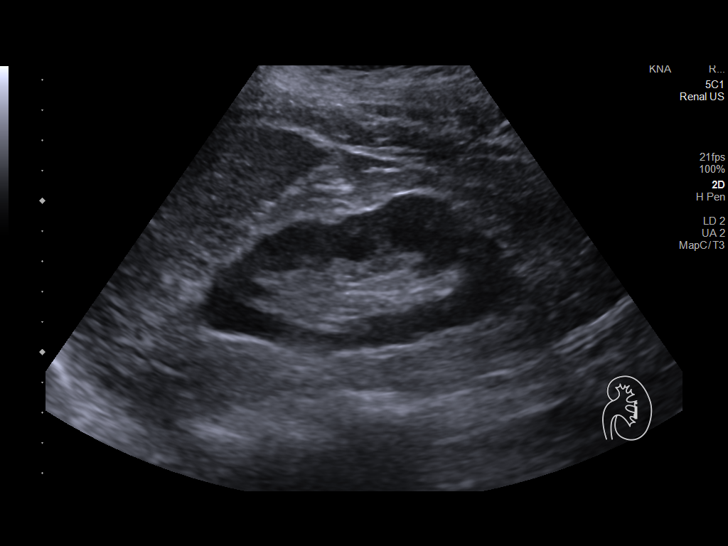
[im 45/60]
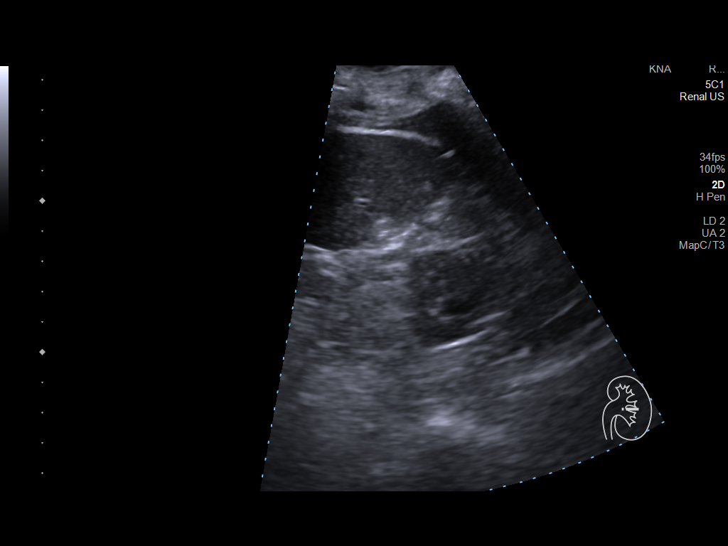
[im 50/60]
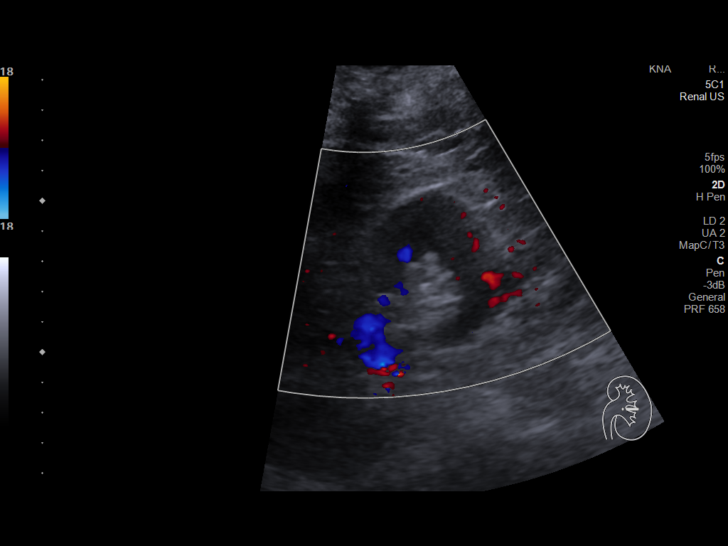
[im 55/60]
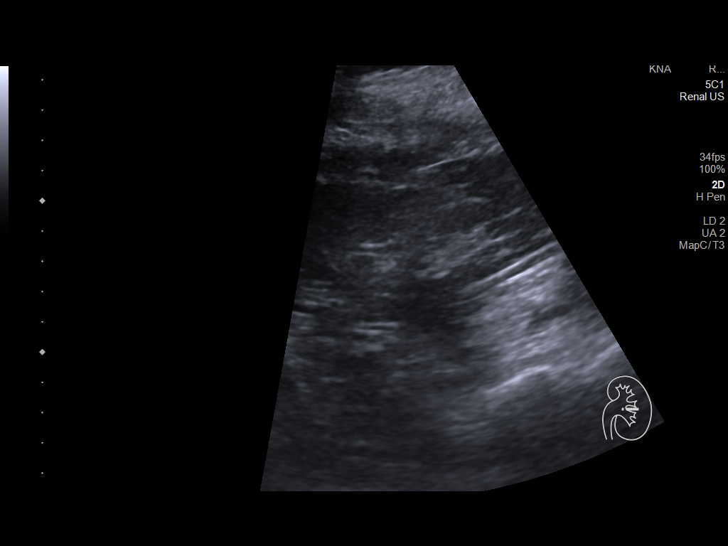
[im 60/60]
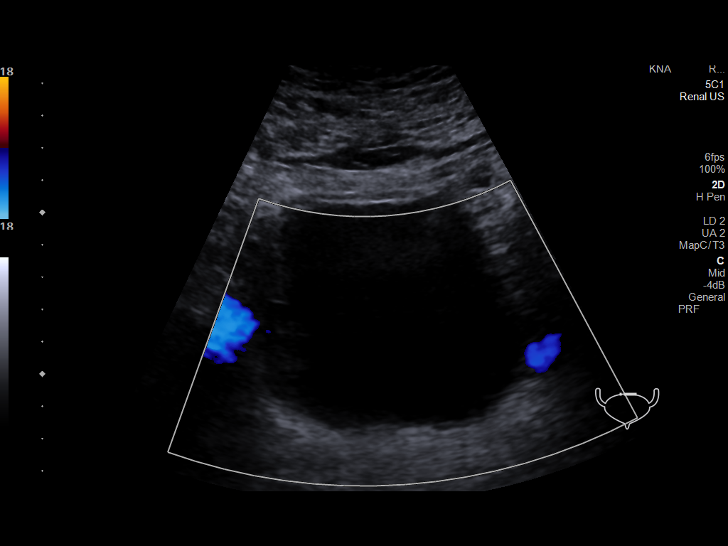

[14 of 25 positions shown; findings below may reference images not displayed]

FINDINGS: Right Kidney:

Renal measurements: 11.7 x 4.9 x 4.5 cm = volume: 134.6 mL.
Echogenicity within normal limits. No mass or hydronephrosis
visualized.

Left Kidney:

Renal measurements: 10.9 x 5.1 x 5.2 cm = volume: 151.2 mL.
Echogenicity within normal limits. No mass or hydronephrosis
visualized. 5.9 cm left lower pole renal calculus.

Bladder:

Appears normal for degree of bladder distention.

Other:

Increased hepatic echogenicity as can be seen with hepatic
steatosis.
IMPRESSION: 1.  Nonobstructing 5.9 mm left lower pole renal calculus.

2.  No obstructive uropathy.

## 2023-04-08 DIAGNOSIS — E1165 Type 2 diabetes mellitus with hyperglycemia: Secondary | ICD-10-CM | POA: Diagnosis not present

## 2023-04-08 DIAGNOSIS — J45909 Unspecified asthma, uncomplicated: Secondary | ICD-10-CM | POA: Diagnosis not present

## 2023-04-08 DIAGNOSIS — Z299 Encounter for prophylactic measures, unspecified: Secondary | ICD-10-CM | POA: Diagnosis not present

## 2023-04-08 DIAGNOSIS — I10 Essential (primary) hypertension: Secondary | ICD-10-CM | POA: Diagnosis not present

## 2023-04-11 DIAGNOSIS — H43813 Vitreous degeneration, bilateral: Secondary | ICD-10-CM | POA: Diagnosis not present

## 2023-04-21 DIAGNOSIS — E1142 Type 2 diabetes mellitus with diabetic polyneuropathy: Secondary | ICD-10-CM | POA: Diagnosis not present

## 2023-04-21 DIAGNOSIS — M79673 Pain in unspecified foot: Secondary | ICD-10-CM | POA: Diagnosis not present

## 2023-04-21 DIAGNOSIS — B351 Tinea unguium: Secondary | ICD-10-CM | POA: Diagnosis not present

## 2023-04-21 DIAGNOSIS — L84 Corns and callosities: Secondary | ICD-10-CM | POA: Diagnosis not present

## 2023-06-27 DIAGNOSIS — I10 Essential (primary) hypertension: Secondary | ICD-10-CM | POA: Diagnosis not present

## 2023-06-27 DIAGNOSIS — K5900 Constipation, unspecified: Secondary | ICD-10-CM | POA: Diagnosis not present

## 2023-06-27 DIAGNOSIS — Z299 Encounter for prophylactic measures, unspecified: Secondary | ICD-10-CM | POA: Diagnosis not present

## 2023-06-27 DIAGNOSIS — Z0000 Encounter for general adult medical examination without abnormal findings: Secondary | ICD-10-CM | POA: Diagnosis not present

## 2023-06-27 DIAGNOSIS — G4730 Sleep apnea, unspecified: Secondary | ICD-10-CM | POA: Diagnosis not present

## 2023-06-30 DIAGNOSIS — B351 Tinea unguium: Secondary | ICD-10-CM | POA: Diagnosis not present

## 2023-06-30 DIAGNOSIS — L84 Corns and callosities: Secondary | ICD-10-CM | POA: Diagnosis not present

## 2023-06-30 DIAGNOSIS — M79676 Pain in unspecified toe(s): Secondary | ICD-10-CM | POA: Diagnosis not present

## 2023-06-30 DIAGNOSIS — E1142 Type 2 diabetes mellitus with diabetic polyneuropathy: Secondary | ICD-10-CM | POA: Diagnosis not present

## 2023-07-07 DIAGNOSIS — N189 Chronic kidney disease, unspecified: Secondary | ICD-10-CM | POA: Diagnosis not present

## 2023-07-07 DIAGNOSIS — D631 Anemia in chronic kidney disease: Secondary | ICD-10-CM | POA: Diagnosis not present

## 2023-07-07 DIAGNOSIS — D649 Anemia, unspecified: Secondary | ICD-10-CM | POA: Diagnosis not present

## 2023-07-07 DIAGNOSIS — R809 Proteinuria, unspecified: Secondary | ICD-10-CM | POA: Diagnosis not present

## 2023-07-11 DIAGNOSIS — M25551 Pain in right hip: Secondary | ICD-10-CM | POA: Diagnosis not present

## 2023-07-11 DIAGNOSIS — Z299 Encounter for prophylactic measures, unspecified: Secondary | ICD-10-CM | POA: Diagnosis not present

## 2023-07-11 DIAGNOSIS — E1165 Type 2 diabetes mellitus with hyperglycemia: Secondary | ICD-10-CM | POA: Diagnosis not present

## 2023-07-11 DIAGNOSIS — I10 Essential (primary) hypertension: Secondary | ICD-10-CM | POA: Diagnosis not present

## 2023-07-12 DIAGNOSIS — R809 Proteinuria, unspecified: Secondary | ICD-10-CM | POA: Diagnosis not present

## 2023-07-12 DIAGNOSIS — E1122 Type 2 diabetes mellitus with diabetic chronic kidney disease: Secondary | ICD-10-CM | POA: Diagnosis not present

## 2023-07-12 DIAGNOSIS — N182 Chronic kidney disease, stage 2 (mild): Secondary | ICD-10-CM | POA: Diagnosis not present

## 2023-07-12 DIAGNOSIS — E1129 Type 2 diabetes mellitus with other diabetic kidney complication: Secondary | ICD-10-CM | POA: Diagnosis not present

## 2023-09-22 DIAGNOSIS — Z299 Encounter for prophylactic measures, unspecified: Secondary | ICD-10-CM | POA: Diagnosis not present

## 2023-09-22 DIAGNOSIS — L0291 Cutaneous abscess, unspecified: Secondary | ICD-10-CM | POA: Diagnosis not present

## 2023-09-22 DIAGNOSIS — E1169 Type 2 diabetes mellitus with other specified complication: Secondary | ICD-10-CM | POA: Diagnosis not present

## 2023-09-22 DIAGNOSIS — I10 Essential (primary) hypertension: Secondary | ICD-10-CM | POA: Diagnosis not present

## 2023-09-22 DIAGNOSIS — J069 Acute upper respiratory infection, unspecified: Secondary | ICD-10-CM | POA: Diagnosis not present

## 2023-09-29 DIAGNOSIS — L84 Corns and callosities: Secondary | ICD-10-CM | POA: Diagnosis not present

## 2023-09-29 DIAGNOSIS — M79676 Pain in unspecified toe(s): Secondary | ICD-10-CM | POA: Diagnosis not present

## 2023-09-29 DIAGNOSIS — B351 Tinea unguium: Secondary | ICD-10-CM | POA: Diagnosis not present

## 2023-09-29 DIAGNOSIS — E1142 Type 2 diabetes mellitus with diabetic polyneuropathy: Secondary | ICD-10-CM | POA: Diagnosis not present

## 2023-10-24 DIAGNOSIS — J45909 Unspecified asthma, uncomplicated: Secondary | ICD-10-CM | POA: Diagnosis not present

## 2023-10-24 DIAGNOSIS — I10 Essential (primary) hypertension: Secondary | ICD-10-CM | POA: Diagnosis not present

## 2023-10-24 DIAGNOSIS — L739 Follicular disorder, unspecified: Secondary | ICD-10-CM | POA: Diagnosis not present

## 2023-10-24 DIAGNOSIS — E1169 Type 2 diabetes mellitus with other specified complication: Secondary | ICD-10-CM | POA: Diagnosis not present

## 2023-10-24 DIAGNOSIS — Z299 Encounter for prophylactic measures, unspecified: Secondary | ICD-10-CM | POA: Diagnosis not present

## 2023-10-25 DIAGNOSIS — D631 Anemia in chronic kidney disease: Secondary | ICD-10-CM | POA: Diagnosis not present

## 2023-10-25 DIAGNOSIS — N189 Chronic kidney disease, unspecified: Secondary | ICD-10-CM | POA: Diagnosis not present

## 2023-10-25 DIAGNOSIS — R809 Proteinuria, unspecified: Secondary | ICD-10-CM | POA: Diagnosis not present

## 2023-10-31 DIAGNOSIS — R809 Proteinuria, unspecified: Secondary | ICD-10-CM | POA: Diagnosis not present

## 2023-10-31 DIAGNOSIS — E1122 Type 2 diabetes mellitus with diabetic chronic kidney disease: Secondary | ICD-10-CM | POA: Diagnosis not present

## 2023-10-31 DIAGNOSIS — E1129 Type 2 diabetes mellitus with other diabetic kidney complication: Secondary | ICD-10-CM | POA: Diagnosis not present

## 2024-01-03 DIAGNOSIS — M79675 Pain in left toe(s): Secondary | ICD-10-CM | POA: Diagnosis not present

## 2024-01-03 DIAGNOSIS — L84 Corns and callosities: Secondary | ICD-10-CM | POA: Diagnosis not present

## 2024-01-03 DIAGNOSIS — M79674 Pain in right toe(s): Secondary | ICD-10-CM | POA: Diagnosis not present

## 2024-01-03 DIAGNOSIS — E1142 Type 2 diabetes mellitus with diabetic polyneuropathy: Secondary | ICD-10-CM | POA: Diagnosis not present

## 2024-01-03 DIAGNOSIS — B351 Tinea unguium: Secondary | ICD-10-CM | POA: Diagnosis not present

## 2024-01-11 ENCOUNTER — Encounter: Payer: Self-pay | Admitting: Gastroenterology

## 2024-02-06 DIAGNOSIS — I10 Essential (primary) hypertension: Secondary | ICD-10-CM | POA: Diagnosis not present

## 2024-02-06 DIAGNOSIS — J309 Allergic rhinitis, unspecified: Secondary | ICD-10-CM | POA: Diagnosis not present

## 2024-02-06 DIAGNOSIS — D631 Anemia in chronic kidney disease: Secondary | ICD-10-CM | POA: Diagnosis not present

## 2024-02-06 DIAGNOSIS — R809 Proteinuria, unspecified: Secondary | ICD-10-CM | POA: Diagnosis not present

## 2024-02-06 DIAGNOSIS — Z299 Encounter for prophylactic measures, unspecified: Secondary | ICD-10-CM | POA: Diagnosis not present

## 2024-02-06 DIAGNOSIS — E1169 Type 2 diabetes mellitus with other specified complication: Secondary | ICD-10-CM | POA: Diagnosis not present

## 2024-02-06 DIAGNOSIS — E211 Secondary hyperparathyroidism, not elsewhere classified: Secondary | ICD-10-CM | POA: Diagnosis not present

## 2024-02-06 DIAGNOSIS — N189 Chronic kidney disease, unspecified: Secondary | ICD-10-CM | POA: Diagnosis not present

## 2024-02-06 DIAGNOSIS — E559 Vitamin D deficiency, unspecified: Secondary | ICD-10-CM | POA: Diagnosis not present

## 2024-02-09 DIAGNOSIS — E1122 Type 2 diabetes mellitus with diabetic chronic kidney disease: Secondary | ICD-10-CM | POA: Diagnosis not present

## 2024-02-09 DIAGNOSIS — E1129 Type 2 diabetes mellitus with other diabetic kidney complication: Secondary | ICD-10-CM | POA: Diagnosis not present

## 2024-02-09 DIAGNOSIS — R809 Proteinuria, unspecified: Secondary | ICD-10-CM | POA: Diagnosis not present

## 2024-02-09 DIAGNOSIS — N182 Chronic kidney disease, stage 2 (mild): Secondary | ICD-10-CM | POA: Diagnosis not present

## 2024-02-17 DIAGNOSIS — Z1331 Encounter for screening for depression: Secondary | ICD-10-CM | POA: Diagnosis not present

## 2024-02-17 DIAGNOSIS — Z299 Encounter for prophylactic measures, unspecified: Secondary | ICD-10-CM | POA: Diagnosis not present

## 2024-02-17 DIAGNOSIS — Z125 Encounter for screening for malignant neoplasm of prostate: Secondary | ICD-10-CM | POA: Diagnosis not present

## 2024-02-17 DIAGNOSIS — I10 Essential (primary) hypertension: Secondary | ICD-10-CM | POA: Diagnosis not present

## 2024-02-17 DIAGNOSIS — Z0000 Encounter for general adult medical examination without abnormal findings: Secondary | ICD-10-CM | POA: Diagnosis not present

## 2024-02-17 DIAGNOSIS — R7989 Other specified abnormal findings of blood chemistry: Secondary | ICD-10-CM | POA: Diagnosis not present

## 2024-02-17 DIAGNOSIS — E7800 Pure hypercholesterolemia, unspecified: Secondary | ICD-10-CM | POA: Diagnosis not present

## 2024-02-17 DIAGNOSIS — Z1339 Encounter for screening examination for other mental health and behavioral disorders: Secondary | ICD-10-CM | POA: Diagnosis not present

## 2024-02-17 DIAGNOSIS — G4730 Sleep apnea, unspecified: Secondary | ICD-10-CM | POA: Diagnosis not present

## 2024-02-17 DIAGNOSIS — Z7189 Other specified counseling: Secondary | ICD-10-CM | POA: Diagnosis not present

## 2024-02-23 ENCOUNTER — Ambulatory Visit: Admitting: Gastroenterology

## 2024-02-23 ENCOUNTER — Encounter: Payer: Self-pay | Admitting: *Deleted

## 2024-02-23 ENCOUNTER — Other Ambulatory Visit: Payer: Self-pay | Admitting: *Deleted

## 2024-02-23 ENCOUNTER — Telehealth: Payer: Self-pay | Admitting: *Deleted

## 2024-02-23 VITALS — BP 106/67 | HR 92 | Temp 97.7°F | Ht 61.0 in | Wt 174.6 lb

## 2024-02-23 DIAGNOSIS — K219 Gastro-esophageal reflux disease without esophagitis: Secondary | ICD-10-CM

## 2024-02-23 DIAGNOSIS — Z860101 Personal history of adenomatous and serrated colon polyps: Secondary | ICD-10-CM | POA: Diagnosis not present

## 2024-02-23 DIAGNOSIS — R1011 Right upper quadrant pain: Secondary | ICD-10-CM

## 2024-02-23 MED ORDER — NA SULFATE-K SULFATE-MG SULF 17.5-3.13-1.6 GM/177ML PO SOLN: 1.0000 | Freq: Once | ORAL | 0 refills | Status: AC

## 2024-02-23 NOTE — Patient Instructions (Signed)
 Continue pantoprazole  daily as you are doing.   We are arranging a colonoscopy with Dr. Mordechai April in the near future. You will need to hold Rybelsus for 24 hours prior to this procedure.   Further recommendations to follow!  I enjoyed seeing you again today! I value our relationship and want to provide genuine, compassionate, and quality care. You may receive a survey regarding your visit with me, and I welcome your feedback! Thanks so much for taking the time to complete this. I look forward to seeing you again.      Delman Ferns, PhD, ANP-BC [facility]

## 2024-02-23 NOTE — Progress Notes (Signed)
 Gastroenterology Office Note     Primary Care Physician:  Theoplis Fix, MD  Primary Gastroenterologist: Dr. Mordechai April   Chief Complaint   Chief Complaint  Patient presents with   Follow-up    Follow up on GERD. Pt states its ok. Pretty much controlled . Blood work done for his kidney dr. Also had his wellness PE last week.     History of Present Illness   Edwin Burgess is a 78 y.o. male presenting today with a history of GERD and dysphagia, undergoing EGD Sept 2022 with short peptic esophageal stricture s/p dilation, small hiatal hernia, gastric erosions s/p biopsy, multiple bulbar ulcers and erosions. Gastric mucosa with hyperemia. Negative H.pylori. History of adenomas with surveillance due in 2027. Here for one year follow-up.   GERD well-controlled on once daily PPI. No dysphagia. Occasionally will have RUQ tenderness but relieved after drinking extra water . No postprandial abdominal pain. Was taking 2 stool softeners a day but caused looser stool; just cut back to once daily. Diabetic meds cause constipation. Appetite waxes and wanes, which is chronic. No overt GI bleeding. Taking VIt D3.   Usually weighing about 170,which is similar to last year. He is wanting to have his colonoscopy now instead of in 2027. States he is concerned about his hx of polyps and his maternal grandfather had colon cancer.   Hgb 15.27 Jan 2024.   Jan 2022: Non-bleeding internal hemorrhoids.                           - Diverticulosis in the sigmoid colon and in the                            descending colon.                           - One 8 mm polyp in the ascending colon, removed                            with a cold snare. Resected and retrieved.                           - Two 3 to 4 mm polyps in the sigmoid colon,                            removed with a cold snare. Resected and retrieved.                           - There was significant looping of the colon.  Maternal grandfather with  colon cancer. He desires to have colonoscopy now.   Past Medical History:  Diagnosis Date   Asthma    DM (diabetes mellitus) (HCC)    GERD (gastroesophageal reflux disease)    Hyperlipidemia    Hypertension    Personal history of colonic polyps    Sleep apnea     Past Surgical History:  Procedure Laterality Date   BIOPSY  02/01/2020   Procedure: BIOPSY;  Surgeon: Alyce Jubilee, MD;  [facility];  Service: Endoscopy;;   BIOPSY  06/17/2021   Procedure: BIOPSY;  Surgeon: Suzette Espy, MD;  [facility];  Service: Endoscopy;;   CATARACT EXTRACTION, BILATERAL     COLONOSCOPY  04/30/2006   dr.fleishman/mild diverticulosis. recommended to have next TCS 2012.   COLONOSCOPY  06/15/2012   ZOX:WRUEA internal hemorrhoids/ Mild diverticulosis/Eight sessile polyps measuring 2-6 mm (tubular adenomas). Surveillance in 5 years due to multiple adenomas   COLONOSCOPY N/A 08/26/2017   One 2 mm polyps in ascending colon, seven 3-6 mm polyps in sigmoid, transverse, and cecum, diverticulosis in rectosigmoid, descending, and cecum, external and internal hemorrhoids. (seven simple adenomas and one hyperplastic polyp). 3 year surveillance    COLONOSCOPY WITH PROPOFOL  N/A 09/30/2020   Non-bleeding internal hemorrhoids. Diverticulosis in the sigmoid colon and in the descending colon. One 8 mm polyp in the ascending colon, removed (tubular adenoma).. Two 3 to 4 mm polyps in the sigmoid colon, removed, Resected and retrieved (tubular adenoma). There was significant looping of the colon.   ESOPHAGOGASTRODUODENOSCOPY  07/06/2010   tight schatzki ring s/p dilation/small hiatal hernia   ESOPHAGOGASTRODUODENOSCOPY N/A 02/01/2020    one benign-appearing intrinsic mild stenosis s/p dilation, mild gastritis, large non-bleeding duodenal diverticulum. Negative H.pylori.    ESOPHAGOGASTRODUODENOSCOPY N/A 06/17/2021   short peptic esophageal stricture s/p dilation, small hiatal hernia, gastric  erosions s/p biopsy, multiple bulbar ulcers and erosions. Gastric mucosa with hyperemia. Negative H.pylori.   MALONEY DILATION N/A 06/17/2021   Procedure: Londa Rival DILATION;  Surgeon: Suzette Espy, MD;  [facility];  Service: Endoscopy;  Laterality: N/A;  EGD with [facility] Dilation   POLYPECTOMY  08/26/2017   Procedure: POLYPECTOMY;  Surgeon: Alyce Jubilee, MD;  [facility];  Service: Endoscopy;;  cecal; transverse colon; ascending colon    POLYPECTOMY  09/30/2020   Procedure: POLYPECTOMY;  Surgeon: Vinetta Greening, DO;  [facility];  Service: Endoscopy;;   SAVORY DILATION N/A 02/01/2020   Procedure: SAVORY DILATION;  Surgeon: Alyce Jubilee, MD;  [facility];  Service: Endoscopy;  Laterality: N/A;    Current Outpatient Medications  Medication Sig Dispense Refill   docusate sodium (COLACE) 100 MG capsule Take 100 mg by mouth daily.     lisinopril  (ZESTRIL ) 2.5 MG tablet Take 1 tablet (2.5 mg total) by mouth daily. Hold onto follow-up with PCP.     montelukast (SINGULAIR) 10 MG tablet Take 10 mg by mouth at bedtime.     pantoprazole  (PROTONIX ) 40 MG tablet Take 1 tablet (40 mg total) by mouth 2 (two) times daily. (Patient taking differently: Take 40 mg by mouth daily.) 60 tablet 3   rosuvastatin  (CRESTOR ) 10 MG tablet Take 10 mg by mouth every evening.      Semaglutide (RYBELSUS) 7 MG TABS Take 7 mg by mouth.     theophylline  (THEODUR) 300 MG 12 hr tablet Take 300 mg by mouth 2 (two) times daily.     No current facility-administered medications for this visit.    Allergies as of 02/23/2024 - Review Complete 02/23/2024  Allergen Reaction Noted   Amoxicillin Nausea And Vomiting 06/16/2021   Tetracycline Itching     Family History  Problem Relation Age of Onset   Colon cancer Maternal Grandfather 63   Breast cancer Maternal Aunt     Social History   Socioeconomic History   Marital status: Married    Spouse name: Not on file    Number of children: 2   Years of education: Not on file   Highest education level: Not on file  Occupational History   Occupation: Geophysicist/field seismologist   Occupation:  retired Engineer, site  Tobacco Use   Smoking status: Never   Smokeless tobacco: Never  Vaping Use   Vaping status: Never Used  Substance and Sexual Activity   Alcohol  use: No   Drug use: No   Sexual activity: Yes  Other Topics Concern   Not on file  Social History Narrative   Not on file   Social Drivers of Health   Financial Resource Strain: Not on file  Food Insecurity: Not on file  Transportation Needs: Not on file  Physical Activity: Not on file  Stress: Not on file  Social Connections: Not on file  Intimate Partner Violence: Not on file     Review of Systems   Gen: Denies any fever, chills, fatigue, weight loss, lack of appetite.  CV: Denies chest pain, heart palpitations, peripheral edema, syncope.  Resp: Denies shortness of breath at rest or with exertion. Denies wheezing or cough.  GI: Denies dysphagia or odynophagia. Denies jaundice, hematemesis, fecal incontinence. GU : Denies urinary burning, urinary frequency, urinary hesitancy MS: Denies joint pain, muscle weakness, cramps, or limitation of movement.  Derm: Denies rash, itching, dry skin Psych: Denies depression, anxiety, memory loss, and confusion Heme: Denies bruising, bleeding, and enlarged lymph nodes.   Physical Exam   BP 106/67   Pulse 92   Temp 97.7 F (36.5 C)   Ht 5\' 1"  (1.549 m)   Wt 174 lb 9.6 oz (79.2 kg)   BMI 32.99 kg/m  General:   Alert and oriented. Pleasant and cooperative. Well-nourished and well-developed.  Head:  Normocephalic and atraumatic. Eyes:  Without icterus Abdomen:  +BS, soft, non-tender and non-distended. No HSM noted. No guarding or rebound. No masses appreciated.  Rectal:  Deferred  Msk:  kyphosis  Extremities:  Without edema. Neurologic:  Alert and  oriented x4;  grossly normal  neurologically. Skin:  Intact without significant lesions or rashes. Psych:  Alert and cooperative. Normal mood and affect.   Assessment   Edwin Burgess is a 78 y.o. male presenting today with a history of  GERD and dysphagia s/p dilation peptic stricture in 2022, hx of adenomas, presenting today for one year follow-up.  GERD: well-controlled on PPI once daily. No alarm signs/symptoms. No dysphagia. He does note a fleeting LUQ discomfort that is like a "catch" but not related to eating/drinking, etc. Suspect musculoskeletal.   Hx of adenomas: last colonoscopy in 2022. He is concerned about his hx of polyps and maternal grandfather with colon cancer, and he desires to have surveillance now instead of 2027. Will arrange this in near future at his request.  RUQ discomfort: noted vaguely and no precipitating factors; he has relief with drinking water . Will monitor for now.     PLAN   Continue PPI daily Proceed with colonoscopy by Dr. Mordechai April  in near future: the risks, benefits, and alternatives have been discussed with the patient in detail. The patient states understanding and desires to proceed.  Hold Rybelsus 24 hours as per protocol   Delman Ferns, PhD, ANP-BC [facility]

## 2024-02-23 NOTE — Telephone Encounter (Signed)
 PA approved via cohere Authorization #[id number]  DOS:  03/26/2024 - 05/26/2024

## 2024-03-20 DIAGNOSIS — B351 Tinea unguium: Secondary | ICD-10-CM | POA: Diagnosis not present

## 2024-03-20 DIAGNOSIS — L84 Corns and callosities: Secondary | ICD-10-CM | POA: Diagnosis not present

## 2024-03-20 DIAGNOSIS — E1142 Type 2 diabetes mellitus with diabetic polyneuropathy: Secondary | ICD-10-CM | POA: Diagnosis not present

## 2024-03-20 DIAGNOSIS — M79674 Pain in right toe(s): Secondary | ICD-10-CM | POA: Diagnosis not present

## 2024-03-20 DIAGNOSIS — M79675 Pain in left toe(s): Secondary | ICD-10-CM | POA: Diagnosis not present

## 2024-03-26 ENCOUNTER — Ambulatory Visit (HOSPITAL_COMMUNITY)
Admission: RE | Admit: 2024-03-26 | Discharge: 2024-03-26 | Disposition: A | Discharge status: Home / Self Care | Attending: Internal Medicine | Admitting: Internal Medicine

## 2024-03-26 ENCOUNTER — Other Ambulatory Visit: Payer: Self-pay

## 2024-03-26 ENCOUNTER — Ambulatory Visit (HOSPITAL_COMMUNITY): Admitting: Anesthesiology

## 2024-03-26 ENCOUNTER — Ambulatory Visit (HOSPITAL_BASED_OUTPATIENT_CLINIC_OR_DEPARTMENT_OTHER): Admitting: Anesthesiology

## 2024-03-26 ENCOUNTER — Encounter (HOSPITAL_COMMUNITY)
Admission: RE | Disposition: A | Discharge status: Home / Self Care | Payer: Self-pay | Source: Home / Self Care | Attending: Internal Medicine

## 2024-03-26 ENCOUNTER — Encounter (HOSPITAL_COMMUNITY): Payer: Self-pay | Admitting: Internal Medicine

## 2024-03-26 DIAGNOSIS — J45909 Unspecified asthma, uncomplicated: Secondary | ICD-10-CM | POA: Insufficient documentation

## 2024-03-26 DIAGNOSIS — D123 Benign neoplasm of transverse colon: Secondary | ICD-10-CM | POA: Insufficient documentation

## 2024-03-26 DIAGNOSIS — Z1211 Encounter for screening for malignant neoplasm of colon: Secondary | ICD-10-CM | POA: Diagnosis not present

## 2024-03-26 DIAGNOSIS — K635 Polyp of colon: Secondary | ICD-10-CM | POA: Diagnosis not present

## 2024-03-26 DIAGNOSIS — Z7985 Long-term (current) use of injectable non-insulin antidiabetic drugs: Secondary | ICD-10-CM | POA: Insufficient documentation

## 2024-03-26 DIAGNOSIS — G4730 Sleep apnea, unspecified: Secondary | ICD-10-CM | POA: Diagnosis not present

## 2024-03-26 DIAGNOSIS — I10 Essential (primary) hypertension: Secondary | ICD-10-CM | POA: Insufficient documentation

## 2024-03-26 DIAGNOSIS — D122 Benign neoplasm of ascending colon: Secondary | ICD-10-CM

## 2024-03-26 DIAGNOSIS — K648 Other hemorrhoids: Secondary | ICD-10-CM | POA: Insufficient documentation

## 2024-03-26 DIAGNOSIS — E119 Type 2 diabetes mellitus without complications: Secondary | ICD-10-CM | POA: Insufficient documentation

## 2024-03-26 DIAGNOSIS — K5730 Diverticulosis of large intestine without perforation or abscess without bleeding: Secondary | ICD-10-CM | POA: Insufficient documentation

## 2024-03-26 HISTORY — PX: COLONOSCOPY: SHX5424

## 2024-03-26 SURGERY — COLONOSCOPY
Anesthesia: General

## 2024-03-26 MED ORDER — PROPOFOL 10 MG/ML IV BOLUS
INTRAVENOUS | Status: DC | PRN
  Administered 2024-03-26: 60 mg via INTRAVENOUS

## 2024-03-26 MED ORDER — PROPOFOL 500 MG/50ML IV EMUL
INTRAVENOUS | Status: DC | PRN
  Administered 2024-03-26: 150 ug/kg/min via INTRAVENOUS

## 2024-03-26 MED ORDER — LIDOCAINE 2% (20 MG/ML) 5 ML SYRINGE
INTRAMUSCULAR | Status: AC
  Filled 2024-03-26: qty 5

## 2024-03-26 MED ORDER — LACTATED RINGERS IV SOLN: INTRAVENOUS | Status: DC

## 2024-03-26 MED ORDER — PROPOFOL 500 MG/50ML IV EMUL
INTRAVENOUS | Status: AC
  Filled 2024-03-26: qty 50

## 2024-03-26 MED ORDER — PHENYLEPHRINE 80 MCG/ML (10ML) SYRINGE FOR IV PUSH (FOR BLOOD PRESSURE SUPPORT)
PREFILLED_SYRINGE | INTRAVENOUS | Status: DC | PRN
  Administered 2024-03-26: 80 ug via INTRAVENOUS
  Administered 2024-03-26: 120 ug via INTRAVENOUS
  Administered 2024-03-26: 160 ug via INTRAVENOUS

## 2024-03-26 MED ORDER — PHENYLEPHRINE 80 MCG/ML (10ML) SYRINGE FOR IV PUSH (FOR BLOOD PRESSURE SUPPORT)
PREFILLED_SYRINGE | INTRAVENOUS | Status: AC
  Filled 2024-03-26: qty 10

## 2024-03-26 MED ORDER — LIDOCAINE 2% (20 MG/ML) 5 ML SYRINGE
INTRAMUSCULAR | Status: DC | PRN
  Administered 2024-03-26: 60 mg via INTRAVENOUS

## 2024-03-26 MED ORDER — LACTATED RINGERS IV SOLN
INTRAVENOUS | Status: DC | PRN
Start: 2024-03-26 — End: 2024-03-26

## 2024-03-26 MED ORDER — PROPOFOL 10 MG/ML IV BOLUS
INTRAVENOUS | Status: AC
  Filled 2024-03-26: qty 20

## 2024-03-26 NOTE — Anesthesia Preprocedure Evaluation (Addendum)
 Anesthesia Evaluation  Patient identified by MRN, date of birth, ID band Patient awake    Reviewed: Allergy & Precautions, H&P , NPO status , Patient's Chart, lab work & pertinent test results  Airway Mallampati: II  TM Distance: >3 FB Neck ROM: Full    Dental no notable dental hx.    Pulmonary asthma , sleep apnea    Pulmonary exam normal breath sounds clear to auscultation       Cardiovascular hypertension, Normal cardiovascular exam Rhythm:Regular Rate:Normal     Neuro/Psych negative neurological ROS  negative psych ROS   GI/Hepatic Neg liver ROS,GERD  ,,  Endo/Other  diabetes  Takes semaglutide pills  Renal/GU Renal diseaseStage 3b  negative genitourinary   Musculoskeletal negative musculoskeletal ROS (+)    Abdominal   Peds negative pediatric ROS (+)  Hematology  (+) Blood dyscrasia, anemia   Anesthesia Other Findings   Reproductive/Obstetrics negative OB ROS                             Anesthesia Physical Anesthesia Plan  ASA: 3  Anesthesia Plan: General   Post-op Pain Management:    Induction: Intravenous  PONV Risk Score and Plan: Propofol  infusion  Airway Management Planned: Nasal Cannula  Additional Equipment:   Intra-op Plan:   Post-operative Plan:   Informed Consent: I have reviewed the patients History and Physical, chart, labs and discussed the procedure including the risks, benefits and alternatives for the proposed anesthesia with the patient or authorized representative who has indicated his/her understanding and acceptance.     Dental advisory given  Plan Discussed with: CRNA  Anesthesia Plan Comments:        Anesthesia Quick Evaluation

## 2024-03-26 NOTE — H&P (Signed)
 Primary Care Physician:  Maree Isles, MD Primary Gastroenterologist:  Dr. Cindie  Pre-Procedure History & Physical: HPI:  Edwin Burgess is a 78 y.o. male is here for a colonoscopy to be performed for surveillance purposes, personal history of adenomatous colon polyps in 2022  Past Medical History:  Diagnosis Date   Asthma    DM (diabetes mellitus) (HCC)    GERD (gastroesophageal reflux disease)    Hyperlipidemia    Hypertension    Personal history of colonic polyps    Sleep apnea     Past Surgical History:  Procedure Laterality Date   BIOPSY  02/01/2020   Procedure: BIOPSY;  Surgeon: Harvey Margo CROME, MD;  [facility]: [facility];  Service: Endoscopy;;   BIOPSY  06/17/2021   Procedure: BIOPSY;  Surgeon: Shaaron Lamar HERO, MD;  Location: [facility];  Service: Endoscopy;;   CATARACT EXTRACTION, BILATERAL     COLONOSCOPY  04/30/2006   dr.fleishman/mild diverticulosis. recommended to have next TCS 2012.   COLONOSCOPY  06/15/2012   DOQ:Dfjoo internal hemorrhoids/ Mild diverticulosis/Eight sessile polyps measuring 2-6 mm (tubular adenomas). Surveillance in 5 years due to multiple adenomas   COLONOSCOPY N/A 08/26/2017   One 2 mm polyps in ascending colon, seven 3-6 mm polyps in sigmoid, transverse, and cecum, diverticulosis in rectosigmoid, descending, and cecum, external and internal hemorrhoids. (seven simple adenomas and one hyperplastic polyp). 3 year surveillance    COLONOSCOPY WITH PROPOFOL  N/A 09/30/2020   Non-bleeding internal hemorrhoids. Diverticulosis in the sigmoid colon and in the descending colon. One 8 mm polyp in the ascending colon, removed (tubular adenoma).. Two 3 to 4 mm polyps in the sigmoid colon, removed, Resected and retrieved (tubular adenoma). There was significant looping of the colon.   ESOPHAGOGASTRODUODENOSCOPY  07/06/2010   tight schatzki ring s/p dilation/small hiatal hernia   ESOPHAGOGASTRODUODENOSCOPY N/A 02/01/2020    one benign-appearing intrinsic  mild stenosis s/p dilation, mild gastritis, large non-bleeding duodenal diverticulum. Negative H.pylori.    ESOPHAGOGASTRODUODENOSCOPY N/A 06/17/2021   short peptic esophageal stricture s/p dilation, small hiatal hernia, gastric erosions s/p biopsy, multiple bulbar ulcers and erosions. Gastric mucosa with hyperemia. Negative H.pylori.   MALONEY DILATION N/A 06/17/2021   Procedure: AGAPITO DILATION;  Surgeon: Shaaron Lamar HERO, MD;  Location: [facility];  Service: Endoscopy;  Laterality: N/A;  EGD with [facility] Dilation   POLYPECTOMY  08/26/2017   Procedure: POLYPECTOMY;  Surgeon: Harvey Margo CROME, MD;  [facility]: [facility];  Service: Endoscopy;;  cecal; transverse colon; ascending colon    POLYPECTOMY  09/30/2020   Procedure: POLYPECTOMY;  Surgeon: Cindie Carlin POUR, DO;  Location: [facility];  Service: Endoscopy;;   SAVORY DILATION N/A 02/01/2020   Procedure: SAVORY DILATION;  Surgeon: Harvey Margo CROME, MD;  [facility]: [facility];  Service: Endoscopy;  Laterality: N/A;    Prior to Admission medications   Medication Sig Start Date End Date Taking? Authorizing Provider  cholecalciferol (VITAMIN D3) 25 MCG (1000 UNIT) tablet Take 2,000 Units by mouth every other day.   Yes [provider]  Semaglutide (RYBELSUS) 7 MG TABS Take 7 mg by mouth.   Yes [provider]  docusate sodium (COLACE) 100 MG capsule Take 100 mg by mouth daily.    [provider]  lisinopril  (ZESTRIL ) 2.5 MG tablet Take 1 tablet (2.5 mg total) by mouth daily. Hold onto follow-up with PCP. 06/25/21   Ricky Fines, MD  montelukast (SINGULAIR) 10 MG tablet Take 10 mg by mouth at bedtime.  [provider]  pantoprazole  (PROTONIX ) 40 MG tablet Take 1 tablet (40 mg total) by mouth 2 (two) times daily. Patient taking differently: Take 40 mg by mouth daily. 06/25/21   Ricky Fines, MD  rosuvastatin  (CRESTOR ) 10 MG tablet Take 10 mg by mouth every evening.     [provider]   theophylline  (THEODUR) 300 MG 12 hr tablet Take 300 mg by mouth 2 (two) times daily. 10/19/11   [provider]    Allergies as of 02/23/2024 - Review Complete 02/23/2024  Allergen Reaction Noted   Amoxicillin Nausea And Vomiting 06/16/2021   Tetracycline Itching     Family History  Problem Relation Age of Onset   Colon cancer Maternal Grandfather 82   Breast cancer Maternal Aunt     Social History   Socioeconomic History   Marital status: Married    Spouse name: Not on file   Number of children: 2   Years of education: Not on file   Highest education level: Not on file  Occupational History   Occupation: Geophysicist/field seismologist   Occupation: retired Engineer, site  Tobacco Use   Smoking status: Never   Smokeless tobacco: Never  Vaping Use   Vaping status: Never Used  Substance and Sexual Activity   Alcohol  use: No   Drug use: No   Sexual activity: Yes  Other Topics Concern   Not on file  Social History Narrative   Not on file   Social Drivers of Health   Financial Resource Strain: Not on file  Food Insecurity: Not on file  Transportation Needs: Not on file  Physical Activity: Not on file  Stress: Not on file  Social Connections: Not on file  Intimate Partner Violence: Not on file    Review of Systems: See HPI, otherwise negative ROS  Physical Exam: Vital signs in last 24 hours: Temp:  [97.6 F (36.4 C)] 97.6 F (36.4 C) (06/30 1057) Pulse Rate:  [74] 74 (06/30 1057) Resp:  [17] 17 (06/30 1057) BP: (129)/(62) 129/62 (06/30 1057) SpO2:  [97 %] 97 % (06/30 1057) Weight:  [77.1 kg] 77.1 kg (06/30 1043)   General:   Alert,  Well-developed, well-nourished, pleasant and cooperative in NAD Head:  Normocephalic and atraumatic. Eyes:  Sclera clear, no icterus.   Conjunctiva pink. Ears:  Normal auditory acuity. Nose:  No deformity, discharge,  or lesions. Msk:  Symmetrical without gross deformities. Normal posture. Extremities:  Without clubbing or  edema. Neurologic:  Alert and  oriented x4;  grossly normal neurologically. Skin:  Intact without significant lesions or rashes. Psych:  Alert and cooperative. Normal mood and affect.  Impression/Plan: Edwin Burgess is here for a colonoscopy to be performed for surveillance purposes, personal history of adenomatous colon polyps in 2022  The risks of the procedure including infection, bleed, or perforation as well as benefits, limitations, alternatives and imponderables have been reviewed with the patient. Questions have been answered. All parties agreeable.

## 2024-03-26 NOTE — Op Note (Signed)
 [facility] [address] Patient Name: Edwin Burgess Procedure Date: 03/26/2024 11:41 AM MRN: [id number] Date of Birth: 04/28/1946 Attending MD: Carlin POUR. Cindie , [place], [id number] CSN: [id number] Age: 78 Admit Type: Outpatient Procedure:                Colonoscopy Indications:              Surveillance: Personal history of colonic polyps                            (unknown histology) on last colonoscopy 3 years ago Providers:                Carlin POUR. Cindie, DO, Harlene Lips, Bascom Blush Referring MD:              Medicines:                See the Anesthesia note for documentation of the                            administered medications Complications:            No immediate complications. Estimated Blood Loss:     Estimated blood loss was minimal. Procedure:                Pre-Anesthesia Assessment:                           - The anesthesia plan was to use monitored                            anesthesia care (MAC).                           After obtaining informed consent, the colonoscope                            was passed under direct vision. Throughout the                            procedure, the patient's blood pressure, pulse, and                            oxygen saturations were monitored continuously. The                            [id number]-HQ190L ([id number]) scope was introduced through                            the anus and advanced to the the cecum, identified                            by appendiceal orifice and ileocecal valve. The                            colonoscopy was somewhat difficult  due to a                            redundant colon and significant looping. Successful                            completion of the procedure was aided by applying                            abdominal pressure. The patient tolerated the                            procedure well. The quality of the bowel                            preparation was  evaluated using the BBPS [facility]                            Bowel Preparation Scale) with scores of: Right                            Colon = 3, Transverse Colon = 3 and Left Colon = 3                            (entire mucosa seen well with no residual staining,                            small fragments of stool or opaque liquid). The                            total BBPS score equals 9. Scope In: 11:53:23 AM Scope Out: 12:14:50 PM Scope Withdrawal Time: 0 hours 16 minutes 7 seconds  Total Procedure Duration: 0 hours 21 minutes 27 seconds  Findings:      Non-bleeding internal hemorrhoids were found during retroflexion.      Many large-mouthed and small-mouthed diverticula were found in the       sigmoid colon and descending colon.      Three sessile polyps were found in the ascending colon. The polyps were       3 to 4 mm in size. These polyps were removed with a cold snare.       Resection and retrieval were complete.      A 4 mm polyp was found in the transverse colon. The polyp was sessile.       The polyp was removed with a cold snare. Resection and retrieval were       complete.      The exam was otherwise without abnormality. Impression:               - Non-bleeding internal hemorrhoids.                           - Diverticulosis in the sigmoid colon and in the                            descending colon.                           -  Three 3 to 4 mm polyps in the ascending colon,                            removed with a cold snare. Resected and retrieved.                           - One 4 mm polyp in the transverse colon, removed                            with a cold snare. Resected and retrieved.                           - The examination was otherwise normal. Moderate Sedation:      Per Anesthesia Care Recommendation:           - Patient has a contact number available for                            emergencies. The signs and symptoms of potential                             delayed complications were discussed with the                            patient. Return to normal activities tomorrow.                            Written discharge instructions were provided to the                            patient.                           - Resume previous diet.                           - Continue present medications.                           - Await pathology results.                           - Repeat colonoscopy in 5 years for surveillance if                            benefits outweigh risks.                           - Return to GI clinic PRN. Procedure Code(s):        --- Professional ---                           [phone], Colonoscopy, flexible; with removal of                            tumor(s), polyp(s), or  other lesion(s) by snare                            technique Diagnosis Code(s):        --- Professional ---                           Z86.010, Personal history of colonic polyps                           K64.8, Other hemorrhoids                           D12.2, Benign neoplasm of ascending colon                           D12.3, Benign neoplasm of transverse colon (hepatic                            flexure or splenic flexure)                           K57.30, Diverticulosis of large intestine without                            perforation or abscess without bleeding CPT copyright 2022 American Medical Association. All rights reserved. The codes documented in this report are preliminary and upon coder review may  be revised to meet current compliance requirements. Carlin POUR. Cindie, DO Carlin POUR. Cindie, DO 03/26/2024 12:19:20 PM This report has been signed electronically. Number of Addenda: 0

## 2024-03-26 NOTE — Anesthesia Postprocedure Evaluation (Signed)
 Anesthesia Post Note  Patient: Edwin Burgess  Procedure(s) Performed: COLONOSCOPY  Patient location during evaluation: PACU Anesthesia Type: General Level of consciousness: awake and alert Pain management: pain level controlled Vital Signs Assessment: post-procedure vital signs reviewed and stable Respiratory status: spontaneous breathing, nonlabored ventilation, respiratory function stable and patient connected to nasal cannula oxygen Cardiovascular status: stable and blood pressure returned to baseline Postop Assessment: no apparent nausea or vomiting Anesthetic complications: no  No notable events documented.   Last Vitals:  Vitals:   03/26/24 1227 03/26/24 1230  BP: (!) 93/46 (!) 94/59  Pulse: 79 83  Resp: 20 19  Temp:    SpO2: 96% 95%    Last Pain:  Vitals:   03/26/24 1227  TempSrc:   PainSc: 0-No pain                 Andrea Limes

## 2024-03-26 NOTE — Discharge Instructions (Addendum)
  Colonoscopy Discharge Instructions  Read the instructions outlined below and refer to this sheet in the next few weeks. These discharge instructions provide you with general information on caring for yourself after you leave the hospital. Your doctor may also give you specific instructions. While your treatment has been planned according to the most current medical practices available, unavoidable complications occasionally occur.   ACTIVITY You may resume your regular activity, but move at a slower pace for the next 24 hours.  Take frequent rest periods for the next 24 hours.  Walking will help get rid of the air and reduce the bloated feeling in your belly (abdomen).  No driving for 24 hours (because of the medicine (anesthesia) used during the test).   Do not sign any important legal documents or operate any machinery for 24 hours (because of the anesthesia used during the test).  NUTRITION Drink plenty of fluids.  You may resume your normal diet as instructed by your doctor.  Begin with a light meal and progress to your normal diet. Heavy or fried foods are harder to digest and may make you feel sick to your stomach (nauseated).  Avoid alcoholic beverages for 24 hours or as instructed.  MEDICATIONS You may resume your normal medications unless your doctor tells you otherwise.  WHAT YOU CAN EXPECT TODAY Some feelings of bloating in the abdomen.  Passage of more gas than usual.  Spotting of blood in your stool or on the toilet paper.  IF YOU HAD POLYPS REMOVED DURING THE COLONOSCOPY: No aspirin products for 7 days or as instructed.  No alcohol  for 7 days or as instructed.  Eat a soft diet for the next 24 hours.  FINDING OUT THE RESULTS OF YOUR TEST Not all test results are available during your visit. If your test results are not back during the visit, make an appointment with your caregiver to find out the results. Do not assume everything is normal if you have not heard from your  caregiver or the medical facility. It is important for you to follow up on all of your test results.  SEEK IMMEDIATE MEDICAL ATTENTION IF: You have more than a spotting of blood in your stool.  Your belly is swollen (abdominal distention).  You are nauseated or vomiting.  You have a temperature over 101.  You have abdominal pain or discomfort that is severe or gets worse throughout the day.   Your colonoscopy revealed 4 small polyp(s) which I removed successfully. Await pathology results, my office will contact you. I recommend repeating colonoscopy in 5 years for surveillance purposes.   You also have diverticulosis and internal hemorrhoids. I would recommend increasing fiber in your diet or adding OTC Benefiber/Metamucil. Be sure to drink at least 4 to 6 glasses of water  daily. Follow-up with GI as needed.   I hope you have a great rest of your week!  Carlin POUR. Cindie, D.O. Gastroenterology and Hepatology [facility]

## 2024-03-26 NOTE — Transfer of Care (Signed)
 Immediate Anesthesia Transfer of Care Note  Patient: Edwin Burgess  Procedure(s) Performed: COLONOSCOPY  Patient Location: PACU  Anesthesia Type:MAC  Level of Consciousness: drowsy  Airway & Oxygen Therapy: Patient Spontanous Breathing  Post-op Assessment: Report given to RN and Post -op Vital signs reviewed and stable  Post vital signs: Reviewed and stable  Last Vitals:  Vitals Value Taken Time  BP 90/41 03/26/24 12:21  Temp    Pulse 70 03/26/24 12:21  Resp 12 03/26/24 12:21  SpO2 97% on RA 03/26/24 12:21    Last Pain:  Vitals:   03/26/24 1149  TempSrc:   PainSc: 0-No pain      Patients Stated Pain Goal: 7 (03/26/24 1043)  Complications: No notable events documented.

## 2024-03-27 ENCOUNTER — Ambulatory Visit: Payer: Self-pay | Admitting: Internal Medicine

## 2024-03-27 LAB — SURGICAL PATHOLOGY

## 2024-03-28 ENCOUNTER — Encounter (HOSPITAL_COMMUNITY): Payer: Self-pay | Admitting: Internal Medicine

## 2024-04-16 DIAGNOSIS — H43811 Vitreous degeneration, right eye: Secondary | ICD-10-CM | POA: Diagnosis not present

## 2024-05-03 DIAGNOSIS — W5503XA Scratched by cat, initial encounter: Secondary | ICD-10-CM | POA: Diagnosis not present

## 2024-05-03 DIAGNOSIS — Z299 Encounter for prophylactic measures, unspecified: Secondary | ICD-10-CM | POA: Diagnosis not present

## 2024-05-03 DIAGNOSIS — M79605 Pain in left leg: Secondary | ICD-10-CM | POA: Diagnosis not present

## 2024-05-03 DIAGNOSIS — I10 Essential (primary) hypertension: Secondary | ICD-10-CM | POA: Diagnosis not present

## 2024-05-14 DIAGNOSIS — E7800 Pure hypercholesterolemia, unspecified: Secondary | ICD-10-CM | POA: Diagnosis not present

## 2024-05-14 DIAGNOSIS — W5503XA Scratched by cat, initial encounter: Secondary | ICD-10-CM | POA: Diagnosis not present

## 2024-05-14 DIAGNOSIS — I10 Essential (primary) hypertension: Secondary | ICD-10-CM | POA: Diagnosis not present

## 2024-05-14 DIAGNOSIS — E119 Type 2 diabetes mellitus without complications: Secondary | ICD-10-CM | POA: Diagnosis not present

## 2024-05-14 DIAGNOSIS — Z299 Encounter for prophylactic measures, unspecified: Secondary | ICD-10-CM | POA: Diagnosis not present

## 2024-05-31 DIAGNOSIS — E1142 Type 2 diabetes mellitus with diabetic polyneuropathy: Secondary | ICD-10-CM | POA: Diagnosis not present

## 2024-05-31 DIAGNOSIS — L84 Corns and callosities: Secondary | ICD-10-CM | POA: Diagnosis not present

## 2024-05-31 DIAGNOSIS — B351 Tinea unguium: Secondary | ICD-10-CM | POA: Diagnosis not present

## 2024-05-31 DIAGNOSIS — M79674 Pain in right toe(s): Secondary | ICD-10-CM | POA: Diagnosis not present

## 2024-05-31 DIAGNOSIS — M79675 Pain in left toe(s): Secondary | ICD-10-CM | POA: Diagnosis not present

## 2024-06-05 DIAGNOSIS — D631 Anemia in chronic kidney disease: Secondary | ICD-10-CM | POA: Diagnosis not present

## 2024-06-05 DIAGNOSIS — R809 Proteinuria, unspecified: Secondary | ICD-10-CM | POA: Diagnosis not present

## 2024-06-05 DIAGNOSIS — N189 Chronic kidney disease, unspecified: Secondary | ICD-10-CM | POA: Diagnosis not present

## 2024-06-11 DIAGNOSIS — N182 Chronic kidney disease, stage 2 (mild): Secondary | ICD-10-CM | POA: Diagnosis not present

## 2024-06-11 DIAGNOSIS — E1122 Type 2 diabetes mellitus with diabetic chronic kidney disease: Secondary | ICD-10-CM | POA: Diagnosis not present

## 2024-06-11 DIAGNOSIS — R809 Proteinuria, unspecified: Secondary | ICD-10-CM | POA: Diagnosis not present

## 2024-06-11 DIAGNOSIS — E1129 Type 2 diabetes mellitus with other diabetic kidney complication: Secondary | ICD-10-CM | POA: Diagnosis not present

## 2024-06-27 DIAGNOSIS — I10 Essential (primary) hypertension: Secondary | ICD-10-CM | POA: Diagnosis not present

## 2024-06-27 DIAGNOSIS — E1169 Type 2 diabetes mellitus with other specified complication: Secondary | ICD-10-CM | POA: Diagnosis not present

## 2024-06-27 DIAGNOSIS — Z0000 Encounter for general adult medical examination without abnormal findings: Secondary | ICD-10-CM | POA: Diagnosis not present

## 2024-06-27 DIAGNOSIS — Z6830 Body mass index (BMI) 30.0-30.9, adult: Secondary | ICD-10-CM | POA: Diagnosis not present

## 2024-06-27 DIAGNOSIS — Z299 Encounter for prophylactic measures, unspecified: Secondary | ICD-10-CM | POA: Diagnosis not present

## 2024-08-09 DIAGNOSIS — E1142 Type 2 diabetes mellitus with diabetic polyneuropathy: Secondary | ICD-10-CM | POA: Diagnosis not present

## 2024-08-09 DIAGNOSIS — M79675 Pain in left toe(s): Secondary | ICD-10-CM | POA: Diagnosis not present

## 2024-08-09 DIAGNOSIS — L84 Corns and callosities: Secondary | ICD-10-CM | POA: Diagnosis not present

## 2024-08-09 DIAGNOSIS — M79674 Pain in right toe(s): Secondary | ICD-10-CM | POA: Diagnosis not present

## 2024-08-09 DIAGNOSIS — B351 Tinea unguium: Secondary | ICD-10-CM | POA: Diagnosis not present

## 2024-08-15 ENCOUNTER — Other Ambulatory Visit (HOSPITAL_COMMUNITY)
Admission: RE | Admit: 2024-08-15 | Discharge: 2024-08-15 | Disposition: A | Discharge status: Home / Self Care | Source: Ambulatory Visit | Attending: Nephrology | Admitting: Nephrology

## 2024-08-15 DIAGNOSIS — N1831 Chronic kidney disease, stage 3a: Secondary | ICD-10-CM | POA: Insufficient documentation

## 2024-08-15 DIAGNOSIS — E1129 Type 2 diabetes mellitus with other diabetic kidney complication: Secondary | ICD-10-CM | POA: Diagnosis not present

## 2024-08-15 DIAGNOSIS — R809 Proteinuria, unspecified: Secondary | ICD-10-CM | POA: Insufficient documentation

## 2024-08-15 DIAGNOSIS — D631 Anemia in chronic kidney disease: Secondary | ICD-10-CM | POA: Insufficient documentation

## 2024-08-15 DIAGNOSIS — E211 Secondary hyperparathyroidism, not elsewhere classified: Secondary | ICD-10-CM | POA: Diagnosis not present

## 2024-08-15 DIAGNOSIS — E1122 Type 2 diabetes mellitus with diabetic chronic kidney disease: Secondary | ICD-10-CM | POA: Diagnosis not present

## 2024-08-15 DIAGNOSIS — E559 Vitamin D deficiency, unspecified: Secondary | ICD-10-CM | POA: Diagnosis not present

## 2024-08-15 LAB — CBC
HCT: 42.4 % (ref 39.0–52.0)
Hemoglobin: 14.7 g/dL (ref 13.0–17.0)
MCH: 32.1 pg (ref 26.0–34.0)
MCHC: 34.7 g/dL (ref 30.0–36.0)
MCV: 92.6 fL (ref 80.0–100.0)
Platelets: 184 K/uL (ref 150–400)
RBC: 4.58 MIL/uL (ref 4.22–5.81)
RDW: 12.3 % (ref 11.5–15.5)
WBC: 5.6 K/uL (ref 4.0–10.5)
nRBC: 0 % (ref 0.0–0.2)

## 2024-08-15 LAB — RENAL FUNCTION PANEL
Albumin: 4.3 g/dL (ref 3.5–5.0)
Anion gap: 10 (ref 5–15)
BUN: 11 mg/dL (ref 8–23)
CO2: 26 mmol/L (ref 22–32)
Calcium: 10.2 mg/dL (ref 8.9–10.3)
Chloride: 103 mmol/L (ref 98–111)
Creatinine, Ser: 1.05 mg/dL (ref 0.61–1.24)
GFR, Estimated: >60 mL/min (ref >60)
Glucose, Bld: 133 mg/dL — ABNORMAL HIGH (ref 70–99)
Phosphorus: 2.6 mg/dL (ref 2.5–4.6)
Potassium: 4.7 mmol/L (ref 3.5–5.1)
Sodium: 139 mmol/L (ref 135–145)

## 2024-08-15 LAB — VITAMIN D 25 HYDROXY (VIT D DEFICIENCY, FRACTURES): Vit D, 25-Hydroxy: 31.17 ng/mL (ref 30–100)

## 2024-08-15 LAB — PROTEIN / CREATININE RATIO, URINE
Creatinine, Urine: 152 mg/dL
Protein Creatinine Ratio: 0.14 mg/mg{creat} (ref 0.00–0.15)
Total Protein, Urine: 22 mg/dL

## 2024-08-16 LAB — CALCIUM / CREATININE RATIO, URINE
Calcium, Ur: 7.2 mg/dL
Calcium/Creat.Ratio: 58 mg/g{creat} (ref 14–318)
Creatinine, Urine: 125.1 mg/dL

## 2024-08-16 LAB — PARATHYROID HORMONE, INTACT (NO CA): PTH: 40 pg/mL (ref 15–65)

## 2024-08-21 DIAGNOSIS — Z299 Encounter for prophylactic measures, unspecified: Secondary | ICD-10-CM | POA: Diagnosis not present

## 2024-08-21 DIAGNOSIS — Z6831 Body mass index (BMI) 31.0-31.9, adult: Secondary | ICD-10-CM | POA: Diagnosis not present

## 2024-08-21 DIAGNOSIS — I10 Essential (primary) hypertension: Secondary | ICD-10-CM | POA: Diagnosis not present

## 2024-08-21 DIAGNOSIS — E119 Type 2 diabetes mellitus without complications: Secondary | ICD-10-CM | POA: Diagnosis not present

## 2024-08-22 DIAGNOSIS — R809 Proteinuria, unspecified: Secondary | ICD-10-CM | POA: Diagnosis not present

## 2024-08-22 DIAGNOSIS — E1122 Type 2 diabetes mellitus with diabetic chronic kidney disease: Secondary | ICD-10-CM | POA: Diagnosis not present

## 2024-08-22 DIAGNOSIS — N182 Chronic kidney disease, stage 2 (mild): Secondary | ICD-10-CM | POA: Diagnosis not present
